# Patient Record
Sex: Male | Born: 1980 | Race: White | Hispanic: No | Marital: Single | Smoking: Light tobacco smoker
Health system: Southern US, Community
[De-identification: ages and names within clinical notes are randomized; demographics above are authoritative.]

## PROBLEM LIST (undated history)

## (undated) DIAGNOSIS — R51 Headache: Secondary | ICD-10-CM

## (undated) DIAGNOSIS — F1010 Alcohol abuse, uncomplicated: Secondary | ICD-10-CM

## (undated) DIAGNOSIS — N289 Disorder of kidney and ureter, unspecified: Secondary | ICD-10-CM

## (undated) DIAGNOSIS — R519 Headache, unspecified: Secondary | ICD-10-CM

## (undated) HISTORY — PX: MASTOIDECTOMY: SHX711

## (undated) HISTORY — PX: HAND TENDON SURGERY: SHX663

---

## 2004-04-29 ENCOUNTER — Emergency Department (HOSPITAL_COMMUNITY): Admission: EM | Admit: 2004-04-29 | Discharge: 2004-04-29 | Payer: Self-pay | Admitting: Emergency Medicine

## 2004-11-22 ENCOUNTER — Emergency Department: Payer: Self-pay | Admitting: Emergency Medicine

## 2005-01-01 ENCOUNTER — Emergency Department: Payer: Self-pay | Admitting: General Practice

## 2005-03-09 ENCOUNTER — Emergency Department: Payer: Self-pay | Admitting: Emergency Medicine

## 2005-06-23 ENCOUNTER — Emergency Department (HOSPITAL_COMMUNITY): Admission: EM | Admit: 2005-06-23 | Discharge: 2005-06-23 | Payer: Self-pay | Admitting: Emergency Medicine

## 2005-07-09 ENCOUNTER — Emergency Department: Payer: Self-pay | Admitting: Emergency Medicine

## 2005-07-15 ENCOUNTER — Emergency Department: Payer: Self-pay | Admitting: Emergency Medicine

## 2005-08-27 ENCOUNTER — Emergency Department (HOSPITAL_COMMUNITY): Admission: EM | Admit: 2005-08-27 | Discharge: 2005-08-27 | Payer: Self-pay | Admitting: *Deleted

## 2005-10-17 ENCOUNTER — Emergency Department (HOSPITAL_COMMUNITY): Admission: EM | Admit: 2005-10-17 | Discharge: 2005-10-17 | Payer: Self-pay | Admitting: Emergency Medicine

## 2005-10-28 ENCOUNTER — Emergency Department: Payer: Self-pay | Admitting: General Practice

## 2005-10-29 ENCOUNTER — Emergency Department (HOSPITAL_COMMUNITY): Admission: EM | Admit: 2005-10-29 | Discharge: 2005-10-29 | Payer: Self-pay | Admitting: Emergency Medicine

## 2006-01-16 ENCOUNTER — Emergency Department (HOSPITAL_COMMUNITY): Admission: EM | Admit: 2006-01-16 | Discharge: 2006-01-16 | Payer: Self-pay | Admitting: Emergency Medicine

## 2007-03-26 ENCOUNTER — Ambulatory Visit (HOSPITAL_COMMUNITY): Admission: RE | Admit: 2007-03-26 | Discharge: 2007-03-26 | Payer: Self-pay | Admitting: Urology

## 2007-03-28 ENCOUNTER — Ambulatory Visit (HOSPITAL_COMMUNITY): Admission: RE | Admit: 2007-03-28 | Discharge: 2007-03-28 | Payer: Self-pay | Admitting: Urology

## 2007-06-04 ENCOUNTER — Emergency Department (HOSPITAL_COMMUNITY): Admission: EM | Admit: 2007-06-04 | Discharge: 2007-06-04 | Payer: Self-pay | Admitting: Emergency Medicine

## 2009-02-14 ENCOUNTER — Emergency Department (HOSPITAL_COMMUNITY): Admission: EM | Admit: 2009-02-14 | Discharge: 2009-02-14 | Payer: Self-pay | Admitting: Emergency Medicine

## 2010-04-11 ENCOUNTER — Emergency Department (HOSPITAL_COMMUNITY)
Admission: EM | Admit: 2010-04-11 | Discharge: 2010-04-11 | Payer: Self-pay | Source: Home / Self Care | Admitting: Family Medicine

## 2010-07-12 ENCOUNTER — Encounter: Payer: Self-pay | Admitting: Urology

## 2011-03-25 LAB — URINALYSIS, ROUTINE W REFLEX MICROSCOPIC
Bilirubin Urine: NEGATIVE
Glucose, UA: NEGATIVE
Ketones, ur: NEGATIVE
Leukocytes, UA: NEGATIVE
Nitrite: NEGATIVE
Protein, ur: NEGATIVE
Specific Gravity, Urine: 1.021
Urobilinogen, UA: 0.2
pH: 6

## 2011-03-25 LAB — URINE MICROSCOPIC-ADD ON

## 2011-03-25 LAB — GC/CHLAMYDIA PROBE AMP, GENITAL
Chlamydia, DNA Probe: NEGATIVE
GC Probe Amp, Genital: NEGATIVE

## 2011-03-25 LAB — URINE CULTURE
Colony Count: NO GROWTH
Culture: NO GROWTH

## 2015-08-30 ENCOUNTER — Inpatient Hospital Stay (HOSPITAL_COMMUNITY)
Admission: EM | Admit: 2015-08-30 | Discharge: 2015-09-05 | DRG: 896 | Disposition: A | Discharge status: Home / Self Care | Payer: Self-pay | Attending: Family Medicine | Admitting: Family Medicine

## 2015-08-30 ENCOUNTER — Encounter (HOSPITAL_COMMUNITY): Payer: Self-pay | Admitting: Family Medicine

## 2015-08-30 DIAGNOSIS — K859 Acute pancreatitis without necrosis or infection, unspecified: Secondary | ICD-10-CM | POA: Diagnosis present

## 2015-08-30 DIAGNOSIS — F10951 Alcohol use, unspecified with alcohol-induced psychotic disorder with hallucinations: Secondary | ICD-10-CM | POA: Diagnosis present

## 2015-08-30 DIAGNOSIS — F10231 Alcohol dependence with withdrawal delirium: Principal | ICD-10-CM | POA: Diagnosis present

## 2015-08-30 DIAGNOSIS — F419 Anxiety disorder, unspecified: Secondary | ICD-10-CM | POA: Diagnosis present

## 2015-08-30 DIAGNOSIS — D899 Disorder involving the immune mechanism, unspecified: Secondary | ICD-10-CM | POA: Diagnosis present

## 2015-08-30 DIAGNOSIS — Z720 Tobacco use: Secondary | ICD-10-CM | POA: Diagnosis present

## 2015-08-30 DIAGNOSIS — Y900 Blood alcohol level of less than 20 mg/100 ml: Secondary | ICD-10-CM | POA: Diagnosis present

## 2015-08-30 DIAGNOSIS — F17200 Nicotine dependence, unspecified, uncomplicated: Secondary | ICD-10-CM | POA: Diagnosis present

## 2015-08-30 DIAGNOSIS — R51 Headache: Secondary | ICD-10-CM | POA: Diagnosis present

## 2015-08-30 DIAGNOSIS — F10239 Alcohol dependence with withdrawal, unspecified: Secondary | ICD-10-CM | POA: Diagnosis present

## 2015-08-30 DIAGNOSIS — F10939 Alcohol use, unspecified with withdrawal, unspecified: Secondary | ICD-10-CM | POA: Diagnosis present

## 2015-08-30 DIAGNOSIS — K858 Other acute pancreatitis without necrosis or infection: Secondary | ICD-10-CM

## 2015-08-30 DIAGNOSIS — F1010 Alcohol abuse, uncomplicated: Secondary | ICD-10-CM | POA: Diagnosis present

## 2015-08-30 DIAGNOSIS — F10931 Alcohol use, unspecified with withdrawal delirium: Secondary | ICD-10-CM | POA: Diagnosis present

## 2015-08-30 DIAGNOSIS — R519 Headache, unspecified: Secondary | ICD-10-CM

## 2015-08-30 DIAGNOSIS — R112 Nausea with vomiting, unspecified: Secondary | ICD-10-CM | POA: Diagnosis present

## 2015-08-30 LAB — RAPID URINE DRUG SCREEN, HOSP PERFORMED
AMPHETAMINES: NOT DETECTED
Barbiturates: NOT DETECTED
Benzodiazepines: POSITIVE — AB
COCAINE: NOT DETECTED
OPIATES: NOT DETECTED
TETRAHYDROCANNABINOL: NOT DETECTED

## 2015-08-30 LAB — URINE MICROSCOPIC-ADD ON
Bacteria, UA: NONE SEEN
Squamous Epithelial / LPF: NONE SEEN
WBC, UA: NONE SEEN WBC/hpf (ref 0–5)

## 2015-08-30 LAB — URINALYSIS, ROUTINE W REFLEX MICROSCOPIC
Bilirubin Urine: NEGATIVE
Glucose, UA: NEGATIVE mg/dL
Ketones, ur: NEGATIVE mg/dL
Leukocytes, UA: NEGATIVE
Nitrite: NEGATIVE
Protein, ur: NEGATIVE mg/dL
Specific Gravity, Urine: 1.015 (ref 1.005–1.030)
pH: 5.5 (ref 5.0–8.0)

## 2015-08-30 LAB — LIPASE, BLOOD: Lipase: 74 U/L — ABNORMAL HIGH (ref 11–51)

## 2015-08-30 LAB — COMPREHENSIVE METABOLIC PANEL
ALT: 101 U/L — ABNORMAL HIGH (ref 17–63)
AST: 57 U/L — ABNORMAL HIGH (ref 15–41)
Albumin: 4.1 g/dL (ref 3.5–5.0)
Alkaline Phosphatase: 111 U/L (ref 38–126)
Anion gap: 11 (ref 5–15)
BUN: 8 mg/dL (ref 6–20)
CO2: 22 mmol/L (ref 22–32)
Calcium: 9.1 mg/dL (ref 8.9–10.3)
Chloride: 104 mmol/L (ref 101–111)
Creatinine, Ser: 1.05 mg/dL (ref 0.61–1.24)
GFR calc Af Amer: >60 mL/min (ref >60)
GFR calc non Af Amer: >60 mL/min (ref >60)
Glucose, Bld: 105 mg/dL — ABNORMAL HIGH (ref 65–99)
Potassium: 3.6 mmol/L (ref 3.5–5.1)
Sodium: 137 mmol/L (ref 135–145)
Total Bilirubin: 0.8 mg/dL (ref 0.3–1.2)
Total Protein: 6.9 g/dL (ref 6.5–8.1)

## 2015-08-30 LAB — CBC
HCT: 40.8 % (ref 39.0–52.0)
Hemoglobin: 13.6 g/dL (ref 13.0–17.0)
MCH: 32.6 pg (ref 26.0–34.0)
MCHC: 33.3 g/dL (ref 30.0–36.0)
MCV: 97.8 fL (ref 78.0–100.0)
Platelets: 332 10*3/uL (ref 150–400)
RBC: 4.17 MIL/uL — ABNORMAL LOW (ref 4.22–5.81)
RDW: 13.6 % (ref 11.5–15.5)
WBC: 6.7 10*3/uL (ref 4.0–10.5)

## 2015-08-30 LAB — MAGNESIUM: Magnesium: 1.7 mg/dL (ref 1.7–2.4)

## 2015-08-30 LAB — ETHANOL: Alcohol, Ethyl (B): <5 mg/dL (ref <5)

## 2015-08-30 LAB — MRSA PCR SCREENING: MRSA BY PCR: NEGATIVE

## 2015-08-30 MED ORDER — SODIUM CHLORIDE 0.9 % IV SOLN
INTRAVENOUS | Status: DC
  Administered 2015-08-30: 11:00:00 via INTRAVENOUS

## 2015-08-30 MED ORDER — LORAZEPAM 2 MG/ML IJ SOLN: 0.0000 mg | Freq: Four times a day (QID) | INTRAMUSCULAR | Status: DC

## 2015-08-30 MED ORDER — HYDROMORPHONE HCL 1 MG/ML IJ SOLN
0.5000 mg | INTRAMUSCULAR | Status: DC | PRN
  Administered 2015-08-31 – 2015-09-01 (×4): 0.5 mg via INTRAVENOUS
  Filled 2015-08-30 (×4): qty 1

## 2015-08-30 MED ORDER — FOLIC ACID 1 MG PO TABS
1.0000 mg | ORAL_TABLET | Freq: Every day | ORAL | Status: DC
  Administered 2015-08-30 – 2015-08-31 (×2): 1 mg via ORAL
  Filled 2015-08-30 (×2): qty 1

## 2015-08-30 MED ORDER — LORAZEPAM 1 MG PO TABS: 1.0000 mg | ORAL_TABLET | Freq: Four times a day (QID) | ORAL | Status: DC | PRN

## 2015-08-30 MED ORDER — LORAZEPAM 2 MG/ML IJ SOLN
0.0000 mg | Freq: Four times a day (QID) | INTRAMUSCULAR | Status: DC
  Administered 2015-08-30 – 2015-08-31 (×2): 2 mg via INTRAVENOUS
  Filled 2015-08-30 (×3): qty 1

## 2015-08-30 MED ORDER — LORAZEPAM 2 MG/ML IJ SOLN: 1.0000 mg | Freq: Four times a day (QID) | INTRAMUSCULAR | Status: DC | PRN

## 2015-08-30 MED ORDER — THIAMINE HCL 100 MG/ML IJ SOLN
Freq: Once | INTRAVENOUS | Status: AC
  Administered 2015-08-30: 14:00:00 via INTRAVENOUS
  Filled 2015-08-30: qty 1000

## 2015-08-30 MED ORDER — PANTOPRAZOLE SODIUM 40 MG IV SOLR
40.0000 mg | Freq: Once | INTRAVENOUS | Status: AC
  Administered 2015-08-30: 40 mg via INTRAVENOUS
  Filled 2015-08-30: qty 40

## 2015-08-30 MED ORDER — LORAZEPAM 2 MG/ML IJ SOLN: 0.0000 mg | Freq: Two times a day (BID) | INTRAMUSCULAR | Status: DC

## 2015-08-30 MED ORDER — VITAMIN B-1 100 MG PO TABS
100.0000 mg | ORAL_TABLET | Freq: Every day | ORAL | Status: DC
  Administered 2015-08-31: 100 mg via ORAL
  Filled 2015-08-30 (×2): qty 1

## 2015-08-30 MED ORDER — ONDANSETRON HCL 4 MG PO TABS: 4.0000 mg | ORAL_TABLET | Freq: Four times a day (QID) | ORAL | Status: DC | PRN

## 2015-08-30 MED ORDER — THIAMINE HCL 100 MG/ML IJ SOLN
100.0000 mg | Freq: Once | INTRAMUSCULAR | Status: AC
  Administered 2015-08-30: 100 mg via INTRAVENOUS
  Filled 2015-08-30: qty 2

## 2015-08-30 MED ORDER — ENOXAPARIN SODIUM 40 MG/0.4ML ~~LOC~~ SOLN
40.0000 mg | SUBCUTANEOUS | Status: DC
  Administered 2015-08-30 – 2015-09-03 (×3): 40 mg via SUBCUTANEOUS
  Filled 2015-08-30 (×5): qty 0.4

## 2015-08-30 MED ORDER — SODIUM CHLORIDE 0.9 % IV BOLUS (SEPSIS)
2000.0000 mL | Freq: Once | INTRAVENOUS | Status: AC
  Administered 2015-08-30: 2000 mL via INTRAVENOUS

## 2015-08-30 MED ORDER — ADULT MULTIVITAMIN W/MINERALS CH
1.0000 | ORAL_TABLET | Freq: Every day | ORAL | Status: DC
  Administered 2015-08-30 – 2015-08-31 (×2): 1 via ORAL
  Filled 2015-08-30 (×2): qty 1

## 2015-08-30 MED ORDER — SODIUM CHLORIDE 0.9% FLUSH
3.0000 mL | Freq: Two times a day (BID) | INTRAVENOUS | Status: DC
Start: 2015-08-30 — End: 2015-09-05
  Administered 2015-08-30 – 2015-09-05 (×13): 3 mL via INTRAVENOUS

## 2015-08-30 MED ORDER — THIAMINE HCL 100 MG/ML IJ SOLN: 100.0000 mg | Freq: Every day | INTRAMUSCULAR | Status: DC

## 2015-08-30 MED ORDER — ONDANSETRON HCL 4 MG/2ML IJ SOLN: 4.0000 mg | Freq: Four times a day (QID) | INTRAMUSCULAR | Status: DC | PRN

## 2015-08-30 MED ORDER — LORAZEPAM 2 MG/ML IJ SOLN
2.0000 mg | INTRAMUSCULAR | Status: DC | PRN
  Administered 2015-08-30: 3 mg via INTRAVENOUS
  Filled 2015-08-30: qty 2

## 2015-08-30 NOTE — ED Notes (Addendum)
Pt here for shaking and anxiety. sts he was seen Thursday and given valium with minimal relief. sts he is a heavy drinker. sts that he has been very confused, dizzy and shaking very bad. sts he drank yesterday and it was 4 or 5 shots. sts usually a half gallon of vodka a day. sts he has also been having some right sided abd pain radiating into his back.

## 2015-08-30 NOTE — H&P (Signed)
[facility] History and Physical  Timothy Richard ZOX:[id number] DOB: 1980-12-08    PCP:   No PCP Per Patient   Chief Complaint: alcohol withdrawal.   HPI: Timothy Richard is an 35 y.o. male with hx of alcohol and tobacco abuse, wanted to quit both, presented to the ED with anxiety, tremor, and palpitation.   He saw a physician outpatient and was given Valium, but minimal relief.  Work up in the ER included a Lipase of 74, minimal elevation of LFTs, and stable hemodynamics.  He was given Ativan and had some improvement.  He denied any drug use.  Hospitalist was asked to admit him for alcohol withdrawal.  He has no prior DT or severe alcohol withdrawal.  He said he drinks hard liquor, but would not quantify.   Rewiew of Systems:  Constitutional: Negative for malaise, fever and chills. No significant weight loss or weight gain Eyes: Negative for eye pain, redness and discharge, diplopia, visual changes, or flashes of light. ENMT: Negative for ear pain, hoarseness, nasal congestion, sinus pressure and sore throat. No headaches; tinnitus, drooling, or problem swallowing. Cardiovascular: Negative for chest pain, palpitations, diaphoresis, dyspnea and peripheral edema. ; No orthopnea, PND Respiratory: Negative for cough, hemoptysis, wheezing and stridor. No pleuritic chestpain. Gastrointestinal: Negative for nausea, vomiting, diarrhea, constipation, abdominal pain, melena, blood in stool, hematemesis, jaundice and rectal bleeding.    Genitourinary: Negative for frequency, dysuria, incontinence,flank pain and hematuria; Musculoskeletal: Negative for back pain and neck pain. Negative for swelling and trauma.;  Skin: . Negative for pruritus, rash, abrasions, bruising and skin lesion.; ulcerations Neuro: Negative for headache, lightheadedness and neck stiffness. Negative for weakness, altered level of consciousness , altered mental status, extremity weakness, burning feet, involuntary  movement, seizure and syncope.  Psych: negative for anxiety, depression, insomnia, tearfulness, panic attacks, hallucinations, paranoia, suicidal or homicidal ideation    History reviewed. No pertinent past medical history.  History reviewed. No pertinent past surgical history.  Medications:  HOME MEDS: Prior to Admission medications   Medication Sig Start Date End Date Taking? Authorizing Provider  DiazePAM (VALIUM PO) Take 1 tablet by mouth daily. Reported on 08/30/2015   Yes Historical Provider, MD     Allergies:  No Known Allergies  Social History:   reports that he has been smoking.  He does not have any smokeless tobacco history on file. He reports that he drinks alcohol. He reports that he does not use illicit drugs.  Family History: History reviewed. No pertinent family history.   Physical Exam: Filed Vitals:   08/30/15 0938 08/30/15 1000 08/30/15 1030 08/30/15 1045  BP: 99/83 147/98 137/94 143/88  Pulse: 86 78 72 71  Temp: 97.9 F (36.6 C)     Resp: 18  17 22   SpO2: 100% 100% 100% 99%   Blood pressure 143/88, pulse 71, temperature 97.9 F (36.6 C), resp. rate 22, SpO2 99 %.  GEN:  Pleasant  patient lying in the stretcher in no acute distress; cooperative with exam. PSYCH:  alert and oriented x4; does not appear anxious or depressed; affect is appropriate. HEENT: Mucous membranes pink and anicteric; PERRLA; EOM intact; no cervical lymphadenopathy nor thyromegaly or carotid bruit; no JVD; There were no stridor. Neck is very supple. Breasts:: Not examined CHEST WALL: No tenderness CHEST: Normal respiration, clear to auscultation bilaterally.  HEART: Regular rate and rhythm.  There are no murmur, rub, or gallops.   BACK: No kyphosis or scoliosis; no CVA tenderness ABDOMEN: soft and non-tender; no  masses, no organomegaly, normal abdominal bowel sounds; no pannus; no intertriginous candida. There is no rebound and no distention. Rectal Exam: Not done EXTREMITIES:  No bone or joint deformity; age-appropriate arthropathy of the hands and knees; no edema; no ulcerations.  There is no calf tenderness. Genitalia: not examined PULSES: 2+ and symmetric SKIN: Normal hydration no rash or ulceration CNS: Cranial nerves 2-12 grossly intact no focal lateralizing neurologic deficit.  Speech is fluent; uvula elevated with phonation, facial symmetry and tongue midline. DTR are normal bilaterally, cerebella exam is intact, barbinski is negative and strengths are equaled bilaterally.  No sensory loss.Tremulous.    Labs on Admission:  Basic Metabolic Panel:  Recent Labs Lab 08/30/15 1013 08/30/15 1016  NA 137  --   K 3.6  --   CL 104  --   CO2 22  --   GLUCOSE 105*  --   BUN 8  --   CREATININE 1.05  --   CALCIUM 9.1  --   MG  --  1.7   Liver Function Tests:  Recent Labs Lab 08/30/15 1013  AST 57*  ALT 101*  ALKPHOS 111  BILITOT 0.8  PROT 6.9  ALBUMIN 4.1    Recent Labs Lab 08/30/15 1013  LIPASE 74*   No results for input(s): AMMONIA in the last 168 hours. CBC:  Recent Labs Lab 08/30/15 1013  WBC 6.7  HGB 13.6  HCT 40.8  MCV 97.8  PLT 332   Assessment/Plan Present on Admission:  . Alcohol withdrawal (HCC) . Pancreatitis  PLAN:  Alcohol withdrawal:   This is his day 2 of withdrawal, and it may get worse.  Will admit to step down.  He will need CIWA scheduled IV Ativan.  Will give D5 IVF, along with Thiamine and Folate.  He has mild pancreatitis, and will give clear liquid.  He was advised to quit cigarettes as well.  Thank you and Good Day.   Other plans as per orders. Code Status: FULL Unk Lightning, MD. FACP [facility] Pager [phone] 7pm to 7am.  08/30/2015, 12:06 PM

## 2015-08-30 NOTE — Progress Notes (Signed)
Patient arrived to 2C06. Patient ambulated to bed with standby assist. CHG bath completed. Patient denies pain or discomfort at this time.

## 2015-08-30 NOTE — ED Provider Notes (Signed)
CSN: [id number]     Arrival date & time 08/30/15  7829 History   First MD Initiated Contact with Patient 08/30/15 [phone]     Chief Complaint  Patient presents with  . Anxiety  . Shaking     (Consider location/radiation/quality/duration/timing/severity/associated sxs/prior Treatment) HPI Patient has been tremulous onset 4 days ago. He was seen by a walk-in clinic 3 days ago prescribed Valium for "anxiety ". He denies anxiety but feels tremulous. Patient drinks half a gallon of liquor per day. He last drank liquor yesterday. He also reports that he went to a party 2 nights ago and has no recall of the event. His friends have been telling him that he's been confused. Other associated symptoms include epigastric pain radiating to right upper quadrant and back, worse with eating. He vomited one time today, yellowish material mixed with food and has had several episodes of diarrhea, nonbloody. Denies fever. He is been treated with Valium, without relief nothing makes symptoms better or worse. History reviewed. No pertinent past medical history. alcoholic History reviewed. No pertinent past surgical history. History reviewed. No pertinent family history. Social History  Substance Use Topics  . Smoking status: Light Tobacco Smoker  . Smokeless tobacco: None  . Alcohol Use: Yes     Comment: half  gallon vodka a day   no illicit drug use  Review of Systems  Constitutional: Negative.   HENT: Negative.   Respiratory: Negative.   Cardiovascular: Negative.   Gastrointestinal: Positive for nausea, vomiting, abdominal pain and diarrhea.  Musculoskeletal: Negative.   Skin: Negative.   Allergic/Immunologic: Positive for immunocompromised state.  Neurological: Positive for dizziness and tremors.  Psychiatric/Behavioral: Negative.   All other systems reviewed and are negative.     Allergies  Review of patient's allergies indicates no known allergies.  Home Medications   Prior to Admission  medications   Not on File   BP 99/83 mmHg  Pulse 86  Temp(Src) 97.9 F (36.6 C)  Resp 18  SpO2 100% Physical Exam  Constitutional: He is oriented to person, place, and time.  Ill-appearing  HENT:  Mucous membranes dry  Eyes: Conjunctivae are normal. Pupils are equal, round, and reactive to light.  Neck: Neck supple. No tracheal deviation present. No thyromegaly present.  Cardiovascular: Normal rate and regular rhythm.   No murmur heard. Pulmonary/Chest: Effort normal and breath sounds normal.  Abdominal: Soft. Bowel sounds are normal. He exhibits no distension. There is tenderness.  Tender at epigastrium  Musculoskeletal: Normal range of motion. He exhibits no edema or tenderness.  Neurological: He is alert and oriented to person, place, and time. No cranial nerve deficit. Coordination normal.  Tremulous  Skin: Skin is warm and dry. No rash noted.  Psychiatric: He has a normal mood and affect.  Nursing note and vitals reviewed.   ED Course  Procedures (including critical care time) Labs Review Labs Reviewed  LIPASE, BLOOD  COMPREHENSIVE METABOLIC PANEL  CBC  URINALYSIS, ROUTINE W REFLEX MICROSCOPIC (NOT AT [facility])    Imaging Review No results found. I have personally reviewed and evaluated these images and lab results as part of my medical decision-making.   EKG Interpretation   Date/Time:  Sunday August 30 2015 11:30:23 EDT Ventricular Rate:  75 PR Interval:  167 QRS Duration: 100 QT Interval:  373 QTC Calculation: 417 R Axis:   54 Text Interpretation:  Sinus rhythm No old tracing to compare Confirmed by  Ethelda Chick  MD, Ayame Rena [phone]) on 08/30/2015 11:58:20 AM  11:15 AM patient feels improved after treatment with intravenous fluids and intravenous lorazepam. He is alert. Continues to be tremulous however tremors are less pronounced. Results for orders placed or performed during the hospital encounter of 08/30/15  Lipase, blood  Result Value Ref Range    Lipase 74 (H) 11 - 51 U/L  Comprehensive metabolic panel  Result Value Ref Range   Sodium 137 135 - 145 mmol/L   Potassium 3.6 3.5 - 5.1 mmol/L   Chloride 104 101 - 111 mmol/L   CO2 22 22 - 32 mmol/L   Glucose, Bld 105 (H) 65 - 99 mg/dL   BUN 8 6 - 20 mg/dL   Creatinine, Ser 1.611.05 0.61 - 1.24 mg/dL   Calcium 9.1 8.9 - 09.610.3 mg/dL   Total Protein 6.9 6.5 - 8.1 g/dL   Albumin 4.1 3.5 - 5.0 g/dL   AST 57 (H) 15 - 41 U/L   ALT 101 (H) 17 - 63 U/L   Alkaline Phosphatase 111 38 - 126 U/L   Total Bilirubin 0.8 0.3 - 1.2 mg/dL   GFR calc non Af Amer >60 >60 mL/min   GFR calc Af Amer >60 >60 mL/min   Anion gap 11 5 - 15  CBC  Result Value Ref Range   WBC 6.7 4.0 - 10.5 K/uL   RBC 4.17 (L) 4.22 - 5.81 MIL/uL   Hemoglobin 13.6 13.0 - 17.0 g/dL   HCT 04.540.8 40.939.0 - 81.152.0 %   MCV 97.8 78.0 - 100.0 fL   MCH 32.6 26.0 - 34.0 pg   MCHC 33.3 30.0 - 36.0 g/dL   RDW 91.413.6 78.211.5 - 95.615.5 %   Platelets 332 150 - 400 K/uL  Magnesium  Result Value Ref Range   Magnesium 1.7 1.7 - 2.4 mg/dL   No results found.  MDM  Dr. Conley RollsLe consulted and will see patient in the emergency department to arrange for inpatient stay He is withdrawing from alcohol. May have element of alcoholic pancreatitis even vomiting and elevated lipase Final diagnoses:  None   diagnosis #1 acute alcohol withdrawal #2 abdominal pain 3 chronic and acute alcohol abuse CRITICAL CARE Performed by: Doug SouJACUBOWITZ,Starletta Houchin Total critical care time: 35 minutes Critical care time was exclusive of separately billable procedures and treating other patients. Critical care was necessary to treat or prevent imminent or life-threatening deterioration. Critical care was time spent personally by me on the following activities: development of treatment plan with patient and/or surrogate as well as nursing, discussions with consultants, evaluation of patient's response to treatment, examination of patient, obtaining history from patient or surrogate, ordering and  performing treatments and interventions, ordering and review of laboratory studies, ordering and review of radiographic studies, pulse oximetry and re-evaluation of patient's condition.    Doug SouSam Mitchell Iwanicki, MD 08/30/15 1201

## 2015-08-30 NOTE — Progress Notes (Signed)
Patient with symmetrical face, clear voice, A&O person, place, month, year.  Strong and equal upper and lower extremities to resistance.  Clearly observable tremor to right hand.  Pt c/o headache 6/10 pain, and right lower quad pain of abdomen that radiates to the hip and lower back; this pain is rated an 8/10.

## 2015-08-31 DIAGNOSIS — F1010 Alcohol abuse, uncomplicated: Secondary | ICD-10-CM

## 2015-08-31 LAB — CBC
HEMATOCRIT: 41.1 % (ref 39.0–52.0)
HEMOGLOBIN: 13.4 g/dL (ref 13.0–17.0)
MCH: 32.2 pg (ref 26.0–34.0)
MCHC: 32.6 g/dL (ref 30.0–36.0)
MCV: 98.8 fL (ref 78.0–100.0)
Platelets: 299 10*3/uL (ref 150–400)
RBC: 4.16 MIL/uL — AB (ref 4.22–5.81)
RDW: 13.5 % (ref 11.5–15.5)
WBC: 5 10*3/uL (ref 4.0–10.5)

## 2015-08-31 LAB — COMPREHENSIVE METABOLIC PANEL
ALBUMIN: 3.6 g/dL (ref 3.5–5.0)
ALK PHOS: 98 U/L (ref 38–126)
ALT: 76 U/L — ABNORMAL HIGH (ref 17–63)
ANION GAP: 10 (ref 5–15)
AST: 38 U/L (ref 15–41)
BUN: <5 mg/dL — ABNORMAL LOW (ref 6–20)
CALCIUM: 9.2 mg/dL (ref 8.9–10.3)
CHLORIDE: 107 mmol/L (ref 101–111)
CO2: 24 mmol/L (ref 22–32)
Creatinine, Ser: 0.99 mg/dL (ref 0.61–1.24)
GFR calc Af Amer: >60 mL/min (ref >60)
GFR calc non Af Amer: >60 mL/min (ref >60)
GLUCOSE: 96 mg/dL (ref 65–99)
Potassium: 3.8 mmol/L (ref 3.5–5.1)
SODIUM: 141 mmol/L (ref 135–145)
Total Bilirubin: 0.9 mg/dL (ref 0.3–1.2)
Total Protein: 6.5 g/dL (ref 6.5–8.1)

## 2015-08-31 MED ORDER — LORAZEPAM 2 MG/ML IJ SOLN
2.0000 mg | INTRAMUSCULAR | Status: DC | PRN
  Administered 2015-08-31 – 2015-09-03 (×11): 2 mg via INTRAVENOUS
  Administered 2015-09-03: 3 mg via INTRAVENOUS
  Administered 2015-09-03 – 2015-09-04 (×6): 2 mg via INTRAVENOUS
  Filled 2015-08-31 (×15): qty 1
  Filled 2015-08-31: qty 2
  Filled 2015-08-31 (×3): qty 1

## 2015-08-31 MED ORDER — CLONIDINE HCL 0.1 MG PO TABS
0.1000 mg | ORAL_TABLET | Freq: Three times a day (TID) | ORAL | Status: DC
  Administered 2015-08-31 – 2015-09-04 (×12): 0.1 mg via ORAL
  Filled 2015-08-31 (×12): qty 1

## 2015-08-31 MED ORDER — ADULT MULTIVITAMIN W/MINERALS CH
1.0000 | ORAL_TABLET | Freq: Every day | ORAL | Status: DC
  Administered 2015-09-01 – 2015-09-05 (×5): 1 via ORAL
  Filled 2015-08-31 (×5): qty 1

## 2015-08-31 MED ORDER — VITAMIN B-1 100 MG PO TABS
100.0000 mg | ORAL_TABLET | Freq: Every day | ORAL | Status: DC
  Administered 2015-09-01 – 2015-09-05 (×5): 100 mg via ORAL
  Filled 2015-08-31 (×5): qty 1

## 2015-08-31 MED ORDER — TRAMADOL HCL 50 MG PO TABS
50.0000 mg | ORAL_TABLET | Freq: Four times a day (QID) | ORAL | Status: DC | PRN
  Administered 2015-08-31 – 2015-09-04 (×5): 50 mg via ORAL
  Filled 2015-08-31 (×6): qty 1

## 2015-08-31 MED ORDER — ACETAMINOPHEN 325 MG PO TABS: 650.0000 mg | ORAL_TABLET | Freq: Four times a day (QID) | ORAL | Status: DC | PRN

## 2015-08-31 MED ORDER — MORPHINE SULFATE (PF) 2 MG/ML IV SOLN: 1.0000 mg | INTRAVENOUS | Status: DC | PRN

## 2015-08-31 MED ORDER — FOLIC ACID 1 MG PO TABS
1.0000 mg | ORAL_TABLET | Freq: Every day | ORAL | Status: DC
  Administered 2015-09-01 – 2015-09-05 (×5): 1 mg via ORAL
  Filled 2015-08-31 (×5): qty 1

## 2015-08-31 NOTE — Progress Notes (Signed)
[place] TEAM 1 - Stepdown/ICU TEAM PROGRESS NOTE  Timothy Richard NWG:[id number] DOB: 02/05/1981 DOA: 08/30/2015 PCP: No PCP Per Patient  Admit HPI / Brief Narrative: 35 y.o. male with hx of alcohol and tobacco abuse, wanted to quit both, presented to the ED with anxiety, tremor, and palpitations. He saw an outpatient physician and was given Valium with minimal relief. Work up in the ER included a Lipase of 74, minimal elevation of LFTs, and stable hemodynamics. He was given Ativan and had some improvement. He denied any drug use. He has no prior DT or severe alcohol withdrawal. He said he drinks hard liquor, but would not quantify.   HPI/Subjective: The patient is very tremulous and anxious.  He complains of severe uncontrolled headache.  He states he is very hungry.  He denies nausea vomiting.  He denies chest pain.  Assessment/Plan:  Acute EtOH withdrawal  Continue CIWA protocol  EtOH abuse - Tobacco abuse  Pt desires to stop drinking permanently   Code Status: FULL Family Communication: no family present at time of exam Disposition Plan: SDU   Consultants: none  Procedures: none  Antibiotics: none  DVT prophylaxis: lovenox   Objective: Blood pressure 122/105, pulse 62, temperature 97.8 F (36.6 C), temperature source Oral, resp. rate 13, height  (1.778 m), weight 81.874 kg (180 lb 8 oz), SpO2 100 %.  Intake/Output Summary (Last 24 hours) at 08/31/15 1346 Last data filed at 08/31/15 1200  Gross per 24 hour  Intake    486 ml  Output   3125 ml  Net  -2639 ml   Exam: General: No acute respiratory distress - alert but agitated  Lungs: Clear to auscultation bilaterally without wheezes or crackles Cardiovascular: Regular rate and rhythm without murmur gallop or rub  Abdomen: Nontender, nondistended, soft, bowel sounds positive, no rebound, no ascites, no appreciable mass Extremities: No significant cyanosis, clubbing, or edema bilateral lower  extremities  Data Reviewed:  Basic Metabolic Panel:  Recent Labs Lab 08/30/15 1013 08/30/15 1016 08/31/15 0304  NA 137  --  141  K 3.6  --  3.8  CL 104  --  107  CO2 22  --  24  GLUCOSE 105*  --  96  BUN 8  --  <5*  CREATININE 1.05  --  0.99  CALCIUM 9.1  --  9.2  MG  --  1.7  --     CBC:  Recent Labs Lab 08/30/15 1013 08/31/15 0304  WBC 6.7 5.0  HGB 13.6 13.4  HCT 40.8 41.1  MCV 97.8 98.8  PLT 332 299    Liver Function Tests:  Recent Labs Lab 08/30/15 1013 08/31/15 0304  AST 57* 38  ALT 101* 76*  ALKPHOS 111 98  BILITOT 0.8 0.9  PROT 6.9 6.5  ALBUMIN 4.1 3.6    Recent Labs Lab 08/30/15 1013  LIPASE 74*   No results for input(s): AMMONIA in the last 168 hours.  Coags: No results for input(s): INR in the last 168 hours.  Invalid input(s): PT No results for input(s): APTT in the last 168 hours.   Recent Results (from the past 240 hour(s))  MRSA PCR Screening     Status: None   Collection Time: 08/30/15  1:36 PM  Result Value Ref Range Status   MRSA by PCR NEGATIVE NEGATIVE Final    Comment:        The GeneXpert MRSA Assay (FDA approved for NASAL specimens only), is one component of a comprehensive  MRSA colonization surveillance program. It is not intended to diagnose MRSA infection nor to guide or monitor treatment for MRSA infections.      Studies:   Recent x-ray studies have been reviewed in detail by the Attending Physician  Scheduled Meds:  Scheduled Meds: . enoxaparin (LOVENOX) injection  40 mg Subcutaneous Q24H  . folic acid  1 mg Oral Daily  . LORazepam  0-4 mg Intravenous Q6H   Followed by  . [START ON 09/01/2015] LORazepam  0-4 mg Intravenous Q12H  . multivitamin with minerals  1 tablet Oral Daily  . sodium chloride flush  3 mL Intravenous Q12H  . thiamine  100 mg Oral Daily   Or  . thiamine  100 mg Intravenous Daily    Time spent on care of this patient: 35 mins   MCCLUNG,JEFFREY T , MD   [facility] Office  [phone] Pager - Text Page per Loretha StaplerAmion as per below:  On-Call/Text Page:      [email or web address]      password [email or web address]  If 7PM-7AM, please contact night-coverage [email or web address] Password [email or web address] 08/31/2015, 1:46 PM   LOS: 1 day

## 2015-09-01 DIAGNOSIS — Z720 Tobacco use: Secondary | ICD-10-CM | POA: Diagnosis present

## 2015-09-01 DIAGNOSIS — F10231 Alcohol dependence with withdrawal delirium: Secondary | ICD-10-CM | POA: Diagnosis present

## 2015-09-01 DIAGNOSIS — F10931 Alcohol use, unspecified with withdrawal delirium: Secondary | ICD-10-CM | POA: Diagnosis present

## 2015-09-01 DIAGNOSIS — R519 Headache, unspecified: Secondary | ICD-10-CM | POA: Diagnosis present

## 2015-09-01 DIAGNOSIS — R51 Headache: Secondary | ICD-10-CM

## 2015-09-01 DIAGNOSIS — F1010 Alcohol abuse, uncomplicated: Secondary | ICD-10-CM | POA: Diagnosis present

## 2015-09-01 LAB — CBC
HCT: 39.9 % (ref 39.0–52.0)
HEMOGLOBIN: 12.9 g/dL — AB (ref 13.0–17.0)
MCH: 32.2 pg (ref 26.0–34.0)
MCHC: 32.3 g/dL (ref 30.0–36.0)
MCV: 99.5 fL (ref 78.0–100.0)
Platelets: 251 10*3/uL (ref 150–400)
RBC: 4.01 MIL/uL — AB (ref 4.22–5.81)
RDW: 13.3 % (ref 11.5–15.5)
WBC: 5.2 10*3/uL (ref 4.0–10.5)

## 2015-09-01 LAB — PROTIME-INR
INR: 0.98 (ref 0.00–1.49)
PROTHROMBIN TIME: 13.2 s (ref 11.6–15.2)

## 2015-09-01 LAB — COMPREHENSIVE METABOLIC PANEL
ALBUMIN: 3.3 g/dL — AB (ref 3.5–5.0)
ALT: 63 U/L (ref 17–63)
AST: 33 U/L (ref 15–41)
Alkaline Phosphatase: 98 U/L (ref 38–126)
Anion gap: 10 (ref 5–15)
BUN: 6 mg/dL (ref 6–20)
CHLORIDE: 106 mmol/L (ref 101–111)
CO2: 23 mmol/L (ref 22–32)
CREATININE: 1.03 mg/dL (ref 0.61–1.24)
Calcium: 9.2 mg/dL (ref 8.9–10.3)
GFR calc Af Amer: >60 mL/min (ref >60)
GFR calc non Af Amer: >60 mL/min (ref >60)
GLUCOSE: 131 mg/dL — AB (ref 65–99)
POTASSIUM: 3.7 mmol/L (ref 3.5–5.1)
SODIUM: 139 mmol/L (ref 135–145)
TOTAL PROTEIN: 6.1 g/dL — AB (ref 6.5–8.1)
Total Bilirubin: 0.5 mg/dL (ref 0.3–1.2)

## 2015-09-01 LAB — VITAMIN B12: Vitamin B-12: 764 pg/mL (ref 180–914)

## 2015-09-01 LAB — FOLATE: FOLATE: 11.2 ng/mL (ref >5.9)

## 2015-09-01 LAB — AMMONIA: Ammonia: 41 umol/L — ABNORMAL HIGH (ref 9–35)

## 2015-09-01 MED ORDER — NICOTINE 21 MG/24HR TD PT24
21.0000 mg | MEDICATED_PATCH | Freq: Every day | TRANSDERMAL | Status: DC
  Administered 2015-09-01 – 2015-09-05 (×5): 21 mg via TRANSDERMAL
  Filled 2015-09-01 (×5): qty 1

## 2015-09-01 MED ORDER — SUMATRIPTAN SUCCINATE 6 MG/0.5ML ~~LOC~~ SOLN
6.0000 mg | Freq: Two times a day (BID) | SUBCUTANEOUS | Status: DC | PRN
  Administered 2015-09-02 (×2): 6 mg via SUBCUTANEOUS
  Filled 2015-09-01 (×3): qty 0.5

## 2015-09-01 NOTE — Progress Notes (Signed)
[place] TEAM 1 - Stepdown/ICU TEAM Progress Note  Timothy Richard:[id number] DOB: 11-03-1980 DOA: 08/30/2015 PCP: No PCP Per Patient  Admit HPI / Brief Narrative: [name] PMHx ETOH abuse, Tobacco abuse.  Wanted to quit both, presented to the ED with anxiety, tremor, and palpitation. He saw a physician outpatient and was given Valium, but minimal relief. Work up in the ER included a Lipase of 74, minimal elevation of LFTs, and stable hemodynamics. He was given Ativan and had some improvement. He denied any drug use. Hospitalist was asked to admit him for alcohol withdrawal. He has no prior DT or severe alcohol withdrawal. He said he drinks hard liquor, but would not quantify.    HPI/Subjective: [name], states drinks 1/2 gallon per day liquor. States was having tremors earlier today, and headache which centered for head and behind eyes. Negative photosensitivity   Assessment/Plan:  Acute EtOH withdrawal/Headache  -Continue CIWA protocol -Sumatriptan 6 mg subcutaneous BID PRN  EtOH abuse - Tobacco abuse  -Pt desires to stop drinking permanently  -Nicotine patch   Code Status: FULL Family Communication: no family present at time of exam Disposition Plan: Resolution withdrawals    Consultants: none  Procedure/Significant Events: none   Culture none  Antibiotics: none  DVT prophylaxis: Lovenox   Devices NA  LINES / TUBES:  NA    Continuous Infusions:   Objective: VITAL SIGNS: Temp: 98.3 F (36.8 C) (03/14 1745) Temp Source: Oral (03/14 1745) BP: 128/98 mmHg (03/14 1745) Pulse Rate: 90 (03/14 1745) SPO2; FIO2:   Intake/Output Summary (Last 24 hours) at 09/01/15 1859 Last data filed at 09/01/15 1700  Gross per 24 hour  Intake    363 ml  Output   1525 ml  Net  -1162 ml     Exam: General:  A/O 4,, headache refractory to treatment, No acute respiratory distress Eyes: Negative headache, negative scleral  hemorrhage ENT: Negative Runny nose, negative gingival bleeding, Neck:  Negative scars, masses, torticollis, lymphadenopathy, JVD Lungs: Clear to auscultation bilaterally without wheezes or crackles Cardiovascular: Regular rate and rhythm without murmur gallop or rub normal S1 and S2 Abdomen:negative abdominal pain, nondistended, positive soft, bowel sounds, no rebound, no ascites, no appreciable mass Extremities: No significant cyanosis, clubbing, or edema bilateral lower extremities Psychiatric:  Negative depression, negative anxiety, negative fatigue, negative mania  Neurologic:  Cranial nerves II through XII intact, tongue/uvula midline, all extremities muscle strength 5/5, sensation intact throughout,  negative dysarthria, negative expressive aphasia, negative receptive aphasia.   Data Reviewed: Basic Metabolic Panel:  Recent Labs Lab 08/30/15 1013 08/30/15 1016 08/31/15 0304 09/01/15 0220  NA 137  --  141 139  K 3.6  --  3.8 3.7  CL 104  --  107 106  CO2 22  --  24 23  GLUCOSE 105*  --  96 131*  BUN 8  --  <5* 6  CREATININE 1.05  --  0.99 1.03  CALCIUM 9.1  --  9.2 9.2  MG  --  1.7  --   --    Liver Function Tests:  Recent Labs Lab 08/30/15 1013 08/31/15 0304 09/01/15 0220  AST 57* 38 33  ALT 101* 76* 63  ALKPHOS 111 98 98  BILITOT 0.8 0.9 0.5  PROT 6.9 6.5 6.1*  ALBUMIN 4.1 3.6 3.3*    Recent Labs Lab 08/30/15 1013  LIPASE 74*    Recent Labs Lab 09/01/15 0220  AMMONIA 41*   CBC:  Recent Labs Lab  08/30/15 1013 08/31/15 0304 09/01/15 0220  WBC 6.7 5.0 5.2  HGB 13.6 13.4 12.9*  HCT 40.8 41.1 39.9  MCV 97.8 98.8 99.5  PLT 332 299 251   Cardiac Enzymes: No results for input(s): CKTOTAL, CKMB, CKMBINDEX, TROPONINI in the last 168 hours. BNP (last 3 results) No results for input(s): BNP in the last 8760 hours.  ProBNP (last 3 results) No results for input(s): PROBNP in the last 8760 hours.  CBG: No results for input(s): GLUCAP in the last  168 hours.  Recent Results (from the past 240 hour(s))  MRSA PCR Screening     Status: None   Collection Time: 08/30/15  1:36 PM  Result Value Ref Range Status   MRSA by PCR NEGATIVE NEGATIVE Final    Comment:        The GeneXpert MRSA Assay (FDA approved for NASAL specimens only), is one component of a comprehensive MRSA colonization surveillance program. It is not intended to diagnose MRSA infection nor to guide or monitor treatment for MRSA infections.      Studies:  Recent x-ray studies have been reviewed in detail by the Attending Physician  Scheduled Meds:  Scheduled Meds: . cloNIDine  0.1 mg Oral TID  . enoxaparin (LOVENOX) injection  40 mg Subcutaneous Q24H  . folic acid  1 mg Oral Daily  . multivitamin with minerals  1 tablet Oral Daily  . nicotine  21 mg Transdermal Daily  . sodium chloride flush  3 mL Intravenous Q12H  . thiamine  100 mg Oral Daily    Time spent on care of this patient: 40 mins   Timothy Richard , MD  [facility] Office  [phone] Pager - [phone]  On-Call/Text Page:      [email or web address]      password [email or web address]  If 7PM-7AM, please contact night-coverage [email or web address] Password [email or web address] 09/01/2015, 6:59 PM   LOS: 2 days   Care during the described time interval was provided by me .  I have reviewed this patient's available data, including medical history, events of note, physical examination, and all test results as part of my evaluation. I have personally reviewed and interpreted all radiology studies.   Carolyne Littlesurtis Woods, MD [phone] Pager

## 2015-09-01 NOTE — Progress Notes (Signed)
Utilization review completed. Arnol Mcgibbon, RN, BSN. 

## 2015-09-02 DIAGNOSIS — Z720 Tobacco use: Secondary | ICD-10-CM

## 2015-09-02 DIAGNOSIS — F10231 Alcohol dependence with withdrawal delirium: Principal | ICD-10-CM

## 2015-09-02 DIAGNOSIS — R51 Headache: Secondary | ICD-10-CM

## 2015-09-02 MED ORDER — BUTALBITAL-APAP-CAFFEINE 50-325-40 MG PO TABS
1.0000 | ORAL_TABLET | ORAL | Status: DC | PRN
  Administered 2015-09-02 – 2015-09-04 (×7): 2 via ORAL
  Filled 2015-09-02 (×7): qty 2

## 2015-09-02 MED ORDER — IBUPROFEN 200 MG PO TABS
200.0000 mg | ORAL_TABLET | Freq: Four times a day (QID) | ORAL | Status: DC | PRN
  Administered 2015-09-03 (×2): 400 mg via ORAL
  Filled 2015-09-02: qty 2
  Filled 2015-09-02: qty 1

## 2015-09-02 MED ORDER — HYDRALAZINE HCL 20 MG/ML IJ SOLN: 5.0000 mg | INTRAMUSCULAR | Status: DC | PRN

## 2015-09-02 NOTE — Progress Notes (Addendum)
[place] TEAM 1 - Stepdown/ICU TEAM PROGRESS NOTE  Geoffery SpruceChristopher B Gervin ZOX:[id number] DOB: 31-Jan-1981 DOA: 08/30/2015 PCP: No PCP Per Patient  Admit HPI / Brief Narrative: 35 y.o. male with hx of alcohol and tobacco abuse, wanted to quit both, presented to the ED with anxiety, tremor, and palpitations. He saw an outpatient physician and was given Valium with minimal relief. Work up in the ER included a Lipase of 74, minimal elevation of LFTs, and stable hemodynamics. He was given Ativan and had some improvement. He denied any drug use. He has no prior DT or severe alcohol withdrawal. He said he drinks hard liquor, but would not quantify.   HPI/Subjective: The patient complains of severe ongoing headache that has not responded "even a little bit" to any of the medications he has been given.  He also reports worsening of his withdrawal-type tremors and agitation today.  He denies chest pain shortness of breath fevers chills nausea or vomiting.  Assessment/Plan:  Acute EtOH withdrawal  Continue CIWA protocol for now  Refractory HA No sx to suggests actual migraine - avoid narcotics - trial of fioricet   EtOH abuse - Tobacco abuse  Pt desires to stop drinking permanently - will counsel further on community options after withdrawal stabilized   Code Status: FULL Family Communication: no family present at time of exam Disposition Plan: SDU   Consultants: none  Procedures: none  Antibiotics: none  DVT prophylaxis: lovenox   Objective: Blood pressure 94/56, pulse 60, temperature 98.6 F (37 C), temperature source Oral, resp. rate 12, height 5\' 10"  (1.778 m), weight 81.874 kg (180 lb 8 oz), SpO2 97 %.  Intake/Output Summary (Last 24 hours) at 09/02/15 1547 Last data filed at 09/02/15 [id number]  Gross per 24 hour  Intake    603 ml  Output   2100 ml  Net  -1497 ml   Exam: General: No acute respiratory distress - alert and tremulous  Lungs: Clear to auscultation  bilaterally - no wheezes or crackles Cardiovascular: Regular rate and rhythm without murmur  Abdomen: Nontender, nondistended, soft, bowel sounds positive, no rebound, no ascites, no appreciable mass Extremities: No significant cyanosis, clubbing, edema bilateral lower extremities  Data Reviewed:  Basic Metabolic Panel:  Recent Labs Lab 08/30/15 1013 08/30/15 1016 08/31/15 0304 09/01/15 0220  NA 137  --  141 139  K 3.6  --  3.8 3.7  CL 104  --  107 106  CO2 22  --  24 23  GLUCOSE 105*  --  96 131*  BUN 8  --  <5* 6  CREATININE 1.05  --  0.99 1.03  CALCIUM 9.1  --  9.2 9.2  MG  --  1.7  --   --     CBC:  Recent Labs Lab 08/30/15 1013 08/31/15 0304 09/01/15 0220  WBC 6.7 5.0 5.2  HGB 13.6 13.4 12.9*  HCT 40.8 41.1 39.9  MCV 97.8 98.8 99.5  PLT 332 299 251    Liver Function Tests:  Recent Labs Lab 08/30/15 1013 08/31/15 0304 09/01/15 0220  AST 57* 38 33  ALT 101* 76* 63  ALKPHOS 111 98 98  BILITOT 0.8 0.9 0.5  PROT 6.9 6.5 6.1*  ALBUMIN 4.1 3.6 3.3*    Recent Labs Lab 08/30/15 1013  LIPASE 74*    Recent Labs Lab 09/01/15 0220  AMMONIA 41*    Coags:  Recent Labs Lab 09/01/15 0220  INR 0.98    Recent Results (from the past 240 hour(s))  MRSA PCR Screening     Status: None   Collection Time: 08/30/15  1:36 PM  Result Value Ref Range Status   MRSA by PCR NEGATIVE NEGATIVE Final    Comment:        The GeneXpert MRSA Assay (FDA approved for NASAL specimens only), is one component of a comprehensive MRSA colonization surveillance program. It is not intended to diagnose MRSA infection nor to guide or monitor treatment for MRSA infections.      Studies:   Recent x-ray studies have been reviewed in detail by the Attending Physician  Scheduled Meds:  Scheduled Meds: . cloNIDine  0.1 mg Oral TID  . enoxaparin (LOVENOX) injection  40 mg Subcutaneous Q24H  . folic acid  1 mg Oral Daily  . multivitamin with minerals  1 tablet Oral  Daily  . nicotine  21 mg Transdermal Daily  . sodium chloride flush  3 mL Intravenous Q12H  . thiamine  100 mg Oral Daily    Time spent on care of this patient: 25 mins   [facility] T , MD   [facility] Office  [phone] Pager - Text Page per Loretha Stapler as per below:  On-Call/Text Page:      [email or web address]      password [email or web address]  If 7PM-7AM, please contact night-coverage [email or web address] Password [email or web address] 09/02/2015, 3:47 PM   LOS: 3 days

## 2015-09-03 DIAGNOSIS — F10951 Alcohol use, unspecified with alcohol-induced psychotic disorder with hallucinations: Secondary | ICD-10-CM | POA: Diagnosis present

## 2015-09-03 LAB — PROTIME-INR
INR: 0.99 (ref 0.00–1.49)
PROTHROMBIN TIME: 13.3 s (ref 11.6–15.2)

## 2015-09-03 LAB — CBC
HCT: 43.4 % (ref 39.0–52.0)
HEMOGLOBIN: 14.4 g/dL (ref 13.0–17.0)
MCH: 32.4 pg (ref 26.0–34.0)
MCHC: 33.2 g/dL (ref 30.0–36.0)
MCV: 97.7 fL (ref 78.0–100.0)
Platelets: 261 10*3/uL (ref 150–400)
RBC: 4.44 MIL/uL (ref 4.22–5.81)
RDW: 13.1 % (ref 11.5–15.5)
WBC: 6.8 10*3/uL (ref 4.0–10.5)

## 2015-09-03 LAB — COMPREHENSIVE METABOLIC PANEL
ALK PHOS: 97 U/L (ref 38–126)
ALT: 63 U/L (ref 17–63)
ANION GAP: 10 (ref 5–15)
AST: 34 U/L (ref 15–41)
Albumin: 3.7 g/dL (ref 3.5–5.0)
BILIRUBIN TOTAL: 0.1 mg/dL — AB (ref 0.3–1.2)
BUN: 12 mg/dL (ref 6–20)
CALCIUM: 9.3 mg/dL (ref 8.9–10.3)
CO2: 23 mmol/L (ref 22–32)
Chloride: 105 mmol/L (ref 101–111)
Creatinine, Ser: 1.11 mg/dL (ref 0.61–1.24)
GLUCOSE: 111 mg/dL — AB (ref 65–99)
Potassium: 4 mmol/L (ref 3.5–5.1)
Sodium: 138 mmol/L (ref 135–145)
TOTAL PROTEIN: 6.8 g/dL (ref 6.5–8.1)

## 2015-09-03 LAB — LIPASE, BLOOD: Lipase: 127 U/L — ABNORMAL HIGH (ref 11–51)

## 2015-09-03 MED ORDER — SUMATRIPTAN SUCCINATE 6 MG/0.5ML ~~LOC~~ SOLN
6.0000 mg | Freq: Two times a day (BID) | SUBCUTANEOUS | Status: DC | PRN
  Administered 2015-09-03 – 2015-09-04 (×2): 6 mg via SUBCUTANEOUS
  Filled 2015-09-03 (×6): qty 0.5

## 2015-09-03 NOTE — Clinical Social Work Note (Signed)
Clinical Social Work Assessment  Patient Details  Name: Timothy Richard MRN: [id number] Date of Birth: 1981-02-22  Date of referral:  09/03/15               Reason for consult:  Substance Use/ETOH Abuse                Permission sought to share information with:  Facility Art therapist granted to share information::  Yes, Verbal Permission Granted  Name::        Agency::  Substance Abuse programs  Relationship::     Contact Information:     Housing/Transportation Living arrangements for the past 2 months:  Apartment Source of Information:  Patient Patient Interpreter Needed:  None Criminal Activity/Legal Involvement Pertinent to Current Situation/Hospitalization:  No - Comment as needed Significant Relationships:  Dependent Children Lives with:  Self Do you feel safe going back to the place where you live?  No (believes he will drink if he returns home) Need for family participation in patient care:  No (Coment)  Care giving concerns: none- pt is independent and has a job   Facilities manager / plan: CSW met with pt to discuss current alcohol use.  Pt reports drinking a half gallon of vodka a day- states he has been drinking heavily for past 10 years.  Pt states that he drinks daily- usually drinking during work at [employer] and then binge drinking once he gets off work- states that he often drinks alcohol in the morning to get going ("like his morning coffee").  Pt reports having 3 children who he pays child support from (separated from mother)- states they are his main motivation for quitting drinking at this time. Reports that they stay with him over the weekend sometimes and that he is able to abstain from drinking for the whole time they are there.  Pt is conflicted about type of rehab he needs: if he goes to inpatient he is scared of losing his job (though he reports they know of his drinking problem and thinks they would accommodate him) and  visitation rights to his children,  If he goes to outpatient pt thinks that he would definitely end up drinking again.  CSW discussed conflict in depth with pt- pt reports he is scared of losing his life if he goes to inpatient and loses his job/children/house but acknowledges that if he continues to drink he will lose those anyway.  Pt states this is his first hospital stay because of alcohol and now sees how detrimental it has been to his health.  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  Patient/Family's Understanding of and Emotional Response to Diagnosis, Current Treatment, and Prognosis:  Pt is very conflicted about his options at this time but understands the severity of his condition and the consequences for continuing to drink  Emotional Assessment Appearance:  Appears stated age Attitude/Demeanor/Rapport:    Affect (typically observed):  Appropriate Orientation:  Oriented to Situation, Oriented to  Time, Oriented to Place, Oriented to Self Alcohol / Substance use:  Alcohol Use Psych involvement (Current and /or in the community):  No (Comment)  Discharge Needs  Concerns to be addressed:  Discharge Planning Concerns, Substance Abuse  Concerns Readmission within the last 30 days:  No Current discharge risk:  Substance Abuse Barriers to Discharge:  Continued Medical Work up   [employer], LCSW 09/03/2015, 5:02 PM

## 2015-09-03 NOTE — Progress Notes (Signed)
[place] TEAM 1 - Stepdown/ICU TEAM Progress Note  Timothy Richard:[id number] DOB: 1980-11-06 DOA: 08/30/2015 PCP: No PCP Per Patient  Admit HPI / Brief Narrative: [name] PMHx ETOH abuse, Tobacco abuse.  Wanted to quit both, presented to the ED with anxiety, tremor, and palpitation. He saw a physician outpatient and was given Valium, but minimal relief. Work up in the ER included a Lipase of 74, minimal elevation of LFTs, and stable hemodynamics. He was given Ativan and had some improvement. He denied any drug use. Hospitalist was asked to admit him for alcohol withdrawal. He has no prior DT or severe alcohol withdrawal. He said he drinks hard liquor, but would not quantify.    HPI/Subjective: 3/16 A/O 4, states drinks 1/2 gallon per day liquor. States continues to have tremors, night sweats, hallucinations (seeing people's faces in the window). Also continues to have headache refractory to Fioricet. Negative N/V, negative abdominal pain.    Assessment/Plan:  Acute EtOH withdrawal/Headache  -Continue CIWA protocol -Fioricet as primary headache medication, however if continues to be refractory Sumatriptan -Sumatriptan 6 mg subcutaneous BID PRN  Hallucinations -See acute alcohol withdrawal  EtOH abuse - Tobacco abuse  -Pt desires to stop drinking permanently  -Nicotine patch   Code Status: FULL Family Communication: no family present at time of exam Disposition Plan: Resolution withdrawals    Consultants: none  Procedure/Significant Events: none   Culture none  Antibiotics: none  DVT prophylaxis: Lovenox   Devices NA  LINES / TUBES:  NA    Continuous Infusions:   Objective: VITAL SIGNS: Temp: 98.2 F (36.8 C) (03/16 1935) Temp Source: Oral (03/16 1935) BP: 108/73 mmHg (03/16 2000) Pulse Rate: 67 (03/16 2000) SPO2; FIO2:   Intake/Output Summary (Last 24 hours) at 09/03/15 2144 Last data filed at 09/03/15 1800  Gross  per 24 hour  Intake    960 ml  Output   1100 ml  Net   -140 ml     Exam: General:  A/O 4,, headache refractory to treatment, No acute respiratory distress Eyes: Negative headache, negative scleral hemorrhage ENT: Negative Runny nose, negative gingival bleeding, Neck:  Negative scars, masses, torticollis, lymphadenopathy, JVD Lungs: Clear to auscultation bilaterally without wheezes or crackles Cardiovascular: Regular rate and rhythm without murmur gallop or rub normal S1 and S2 Abdomen:negative abdominal pain, nondistended, positive soft, bowel sounds, no rebound, no ascites, no appreciable mass Extremities: No significant cyanosis, clubbing, or edema bilateral lower extremities, resting/intention tremors bilateral upper extremity  Psychiatric:  Negative depression, negative anxiety, negative fatigue, negative mania  Neurologic:  Cranial nerves II through XII intact, tongue/uvula midline, all extremities muscle strength 5/5, sensation intact throughout,  negative dysarthria, negative expressive aphasia, negative receptive aphasia.   Data Reviewed: Basic Metabolic Panel:  Recent Labs Lab 08/30/15 1013 08/30/15 1016 08/31/15 0304 09/01/15 0220 09/03/15 0315  NA 137  --  141 139 138  K 3.6  --  3.8 3.7 4.0  CL 104  --  107 106 105  CO2 22  --  GLUCOSE 105*  --  96 131* 111*  BUN 8  --  <5* 6 12  CREATININE 1.05  --  0.99 1.03 1.11  CALCIUM 9.1  --  9.2 9.2 9.3  MG  --  1.7  --   --   --    Liver Function Tests:  Recent Labs Lab 08/30/15 1013 08/31/15 0304 09/01/15 0220 09/03/15 0315  AST 57* 38 33 34  ALT  101* 76* 63 63  ALKPHOS 111 98 98 97  BILITOT 0.8 0.9 0.5 0.1*  PROT 6.9 6.5 6.1* 6.8  ALBUMIN 4.1 3.6 3.3* 3.7    Recent Labs Lab 08/30/15 1013 09/03/15 0315  LIPASE 74* 127*    Recent Labs Lab 09/01/15 0220  AMMONIA 41*   CBC:  Recent Labs Lab 08/30/15 1013 08/31/15 0304 09/01/15 0220 09/03/15 0315  WBC 6.7 5.0 5.2 6.8  HGB 13.6  13.4 12.9* 14.4  HCT 40.8 41.1 39.9 43.4  MCV 97.8 98.8 99.5 97.7  PLT 332 299 251 261   Cardiac Enzymes: No results for input(s): CKTOTAL, CKMB, CKMBINDEX, TROPONINI in the last 168 hours. BNP (last 3 results) No results for input(s): BNP in the last 8760 hours.  ProBNP (last 3 results) No results for input(s): PROBNP in the last 8760 hours.  CBG: No results for input(s): GLUCAP in the last 168 hours.  Recent Results (from the past 240 hour(s))  MRSA PCR Screening     Status: None   Collection Time: 08/30/15  1:36 PM  Result Value Ref Range Status   MRSA by PCR NEGATIVE NEGATIVE Final    Comment:        The GeneXpert MRSA Assay (FDA approved for NASAL specimens only), is one component of a comprehensive MRSA colonization surveillance program. It is not intended to diagnose MRSA infection nor to guide or monitor treatment for MRSA infections.      Studies:  Recent x-ray studies have been reviewed in detail by the Attending Physician  Scheduled Meds:  Scheduled Meds: . cloNIDine  0.1 mg Oral TID  . enoxaparin (LOVENOX) injection  40 mg Subcutaneous Q24H  . folic acid  1 mg Oral Daily  . multivitamin with minerals  1 tablet Oral Daily  . nicotine  21 mg Transdermal Daily  . sodium chloride flush  3 mL Intravenous Q12H  . thiamine  100 mg Oral Daily    Time spent on care of this patient: 40 mins   WOODS, Roselind MessierURTIS J , MD  [facility] Office  [phone] Pager - [phone]  On-Call/Text Page:      [email or web address]      password [email or web address]  If 7PM-7AM, please contact night-coverage [email or web address] Password [email or web address] 09/03/2015, 9:44 PM   LOS: 4 days   Care during the described time interval was provided by me .  I have reviewed this patient's available data, including medical history, events of note, physical examination, and all test results as part of my evaluation. I have personally reviewed and interpreted all radiology studies.   Carolyne Littlesurtis Woods,  MD [phone] Pager

## 2015-09-04 ENCOUNTER — Inpatient Hospital Stay (HOSPITAL_COMMUNITY): Payer: MEDICAID

## 2015-09-04 MED ORDER — LORAZEPAM 2 MG/ML IJ SOLN
2.0000 mg | INTRAMUSCULAR | Status: DC | PRN
  Administered 2015-09-04: 2 mg via INTRAVENOUS
  Filled 2015-09-04: qty 1

## 2015-09-04 MED ORDER — LORAZEPAM 2 MG/ML IJ SOLN
0.5000 mg | Freq: Four times a day (QID) | INTRAMUSCULAR | Status: DC | PRN
  Administered 2015-09-04 – 2015-09-05 (×3): 1 mg via INTRAVENOUS
  Filled 2015-09-04 (×3): qty 1

## 2015-09-04 MED ORDER — PROCHLORPERAZINE EDISYLATE 5 MG/ML IJ SOLN
10.0000 mg | Freq: Once | INTRAMUSCULAR | Status: AC
  Administered 2015-09-04: 10 mg via INTRAVENOUS
  Filled 2015-09-04: qty 2

## 2015-09-04 MED ORDER — PROCHLORPERAZINE EDISYLATE 5 MG/ML IJ SOLN
5.0000 mg | Freq: Three times a day (TID) | INTRAMUSCULAR | Status: DC | PRN
  Administered 2015-09-04 – 2015-09-05 (×2): 5 mg via INTRAVENOUS
  Filled 2015-09-04 (×4): qty 1

## 2015-09-04 MED ORDER — CLONIDINE HCL 0.1 MG PO TABS
0.1000 mg | ORAL_TABLET | Freq: Two times a day (BID) | ORAL | Status: DC
  Filled 2015-09-04: qty 1

## 2015-09-04 MED ORDER — KETOROLAC TROMETHAMINE 30 MG/ML IJ SOLN
30.0000 mg | Freq: Once | INTRAMUSCULAR | Status: AC
  Administered 2015-09-04: 30 mg via INTRAVENOUS
  Filled 2015-09-04: qty 1

## 2015-09-04 MED ORDER — KETOROLAC TROMETHAMINE 30 MG/ML IJ SOLN
30.0000 mg | Freq: Three times a day (TID) | INTRAMUSCULAR | Status: DC | PRN
  Administered 2015-09-04 – 2015-09-05 (×2): 30 mg via INTRAVENOUS
  Filled 2015-09-04 (×2): qty 1

## 2015-09-04 NOTE — Progress Notes (Signed)
Patient verbalized that he still has 8/10 pain from headache that has not been relieved by any of the PRN medications that he has taken thus far. Requests that MD address his pain urgently. Advised patient that I will administer Imitrex as ordered since he did not get relief from prior medicines. MD made aware of patients concerns via text page. Will continue to monitor patient closely.

## 2015-09-04 NOTE — Progress Notes (Signed)
[place] TEAM 1 - Stepdown/ICU TEAM PROGRESS NOTE  Timothy Richard:[id number] DOB: 1981/04/26 DOA: 08/30/2015 PCP: No PCP Per Patient  Admit HPI / Brief Narrative: 35 y.o. male with hx of alcohol and tobacco abuse, wanted to quit both, presented to the ED with anxiety, tremor, and palpitations. He saw an outpatient physician and was given Valium with minimal relief. Work up in the ER included a Lipase of 74, minimal elevation of LFTs, and stable hemodynamics. He was given Ativan and had some improvement. He denied any drug use. He has no prior DT or severe alcohol withdrawal. He said he drinks hard liquor, but would not quantify.   HPI/Subjective: The patient is sleeping comfortably when I entered the room.  He quickly awakens to my voice and answers my questions.  He states that his headache is much improved after Toradol and Compazine.  He states that his "shaking" hasn't improved but is not shaking until after he tells me that.  He denies chest pain or shortness of breath.  Assessment/Plan:  Acute EtOH withdrawal  The patient is alert and oriented and on physical exam no longer tremulous - discontinue CIWA protocol as there are signs the patient is becoming accustomed to frequent Ativan dosing - cont ativan low dose prn only - begin to mobilize - pt trying to decide on intpt v/s outpt EtOH rehab  Refractory HA No sx to suggests actual migraine - avoid narcotics - trial of fioricet failed - did not improve w/ a triptan - compazine and toradol lead to sedation/sleeping - CT head unrevealing   EtOH abuse - Tobacco abuse  Pt desires to stop drinking permanently - he is trying to decide on outpt v/s inpt tx   Code Status: FULL Family Communication: no family present at time of exam Disposition Plan: stable for transfer to tele bed - stop CIWA and follow w/ only judicious prn benzo - NO NARCOTICS - PT/OT evals (says he is dizzy when standing)    Consultants: none  Procedures: none  Antibiotics: none  DVT prophylaxis: lovenox   Objective: Blood pressure 111/80, pulse 58, temperature 97.8 F (36.6 C), temperature source Oral, resp. rate 14, height  (1.778 m), weight 81.874 kg (180 lb 8 oz), SpO2 98 %.  Intake/Output Summary (Last 24 hours) at 09/04/15 1626 Last data filed at 09/04/15 1500  Gross per 24 hour  Intake   1090 ml  Output   1400 ml  Net   -310 ml   Exam: General: No acute respiratory distress - alert and oriented  Lungs: Clear to auscultation bilaterally w/o wheezes or crackles Cardiovascular: Regular rate and rhythm  Abdomen: Nontender, nondistended, soft, bowel sounds positive, no rebound Extremities: No cyanosis, clubbing, or edema bilateral lower extremities  Data Reviewed:  Basic Metabolic Panel:  Recent Labs Lab 08/30/15 1013 08/30/15 1016 08/31/15 0304 09/01/15 0220 09/03/15 0315  NA 137  --  141 139 138  K 3.6  --  3.8 3.7 4.0  CL 104  --  107 106 105  CO2 22  --  GLUCOSE 105*  --  96 131* 111*  BUN 8  --  <5* 6 12  CREATININE 1.05  --  0.99 1.03 1.11  CALCIUM 9.1  --  9.2 9.2 9.3  MG  --  1.7  --   --   --     CBC:  Recent Labs Lab 08/30/15 1013 08/31/15 0304 09/01/15 0220 09/03/15 0315  WBC 6.7  5.0 5.2 6.8  HGB 13.6 13.4 12.9* 14.4  HCT 40.8 41.1 39.9 43.4  MCV 97.8 98.8 99.5 97.7  PLT 332 299 251 261    Liver Function Tests:  Recent Labs Lab 08/30/15 1013 08/31/15 0304 09/01/15 0220 09/03/15 0315  AST 57* 38 33 34  ALT 101* 76* 63 63  ALKPHOS 111 98 98 97  BILITOT 0.8 0.9 0.5 0.1*  PROT 6.9 6.5 6.1* 6.8  ALBUMIN 4.1 3.6 3.3* 3.7    Recent Labs Lab 08/30/15 1013 09/03/15 0315  LIPASE 74* 127*    Recent Labs Lab 09/01/15 0220  AMMONIA 41*    Coags:  Recent Labs Lab 09/01/15 0220 09/03/15 0315  INR 0.98 0.99    Recent Results (from the past 240 hour(s))  MRSA PCR Screening     Status: None   Collection Time:  08/30/15  1:36 PM  Result Value Ref Range Status   MRSA by PCR NEGATIVE NEGATIVE Final    Comment:        The GeneXpert MRSA Assay (FDA approved for NASAL specimens only), is one component of a comprehensive MRSA colonization surveillance program. It is not intended to diagnose MRSA infection nor to guide or monitor treatment for MRSA infections.      Studies:   Recent x-ray studies have been reviewed in detail by the Attending Physician  Scheduled Meds:  Scheduled Meds: . cloNIDine  0.1 mg Oral TID  . enoxaparin (LOVENOX) injection  40 mg Subcutaneous Q24H  . folic acid  1 mg Oral Daily  . multivitamin with minerals  1 tablet Oral Daily  . nicotine  21 mg Transdermal Daily  . sodium chloride flush  3 mL Intravenous Q12H  . thiamine  100 mg Oral Daily    Time spent on care of this patient: 25 mins   [facility] Regional Medical CenterMCCLUNG,Deniesha Stenglein T , MD   [facility] Office  [phone] Pager - Text Page per Loretha StaplerAmion as per below:  On-Call/Text Page:      [email or web address]      password [email or web address]  If 7PM-7AM, please contact night-coverage [email or web address] Password [email or web address] 09/04/2015, 4:26 PM   LOS: 5 days

## 2015-09-05 DIAGNOSIS — F10230 Alcohol dependence with withdrawal, uncomplicated: Secondary | ICD-10-CM

## 2015-09-05 MED ORDER — THIAMINE HCL 100 MG PO TABS: 100.0000 mg | ORAL_TABLET | Freq: Every day | ORAL | Status: DC

## 2015-09-05 MED ORDER — BUTALBITAL-APAP-CAFFEINE 50-325-40 MG PO TABS: 1.0000 | ORAL_TABLET | ORAL | Status: DC | PRN

## 2015-09-05 MED ORDER — FOLIC ACID 1 MG PO TABS: 1.0000 mg | ORAL_TABLET | Freq: Every day | ORAL | Status: DC

## 2015-09-05 NOTE — Discharge Summary (Signed)
Physician Discharge Summary  Timothy Richard [name] DOB: 1981/02/07 DOA: 08/30/2015  PCP: No PCP Per Patient  Admit date: 08/30/2015 Discharge date: 09/05/2015  Time spent: > 35 minutes  Recommendations for Outpatient Follow-up:  1. Continue to encourage alcohol cessation   Discharge Diagnoses:  Active Problems:   Alcohol withdrawal (HCC)   Pancreatitis   Alcohol withdrawal delirium, acute, hyperactive (HCC)   Headache above the eye region   Alcohol abuse   Tobacco abuse   Hallucinations due to alcohol [facility](HCC)   Discharge Condition: Stable  Diet recommendation: Regular diet  Filed Weights   08/30/15 1329 09/04/15 2150  Weight: 81.874 kg (180 lb 8 oz) 84.324 kg (185 lb 14.4 oz)    History of present illness:  35 year old presented with complaints of alcohol withdrawal.  Hospital Course:  Alcohol withdrawal - On physical exam patient has no anxiety or tremors. He reportedly states to me that he has not had a drink in over 5 days. As such I do not feel that it is warranted to discharge him on a benzodiazepine or Librium. - I have encouraged patient to look for outpatient social support groups for abstinence of alcohol. - I will allow patient to have 2 days off work to help him set this up - We'll discharge on folate and thiamine supplementation.  Headaches - Improvement with Fioricet will continue on discharge  Procedures:  None  Consultations:  None  Discharge Exam: Filed Vitals:   09/05/15 0027 09/05/15 1100  BP: 123/79 93/46  Pulse: 66 80  Temp:    Resp:      General: Pt in nad, alert and awake Cardiovascular: rrr, no mrg Respiratory: cta bl, no wheezes  Discharge Instructions   Discharge Instructions    Call MD for:  redness, tenderness, or signs of infection (pain, swelling, redness, odor or green/yellow discharge around incision site)    Complete by:  As directed      Call MD for:  severe uncontrolled pain    Complete by:  As directed       Call MD for:  temperature >100.4    Complete by:  As directed      Diet - low sodium heart healthy    Complete by:  As directed      Discharge instructions    Complete by:  As directed   Please abstain from drinking alcohol. In the 2 days provided that I gave you off from work please be sure to set up plans to join social group for abstinence of alcohol.     Increase activity slowly    Complete by:  As directed           Current Discharge Medication List    START taking these medications   Details  butalbital-acetaminophen-caffeine (FIORICET, ESGIC) 50-325-40 MG tablet Take 1-2 tablets by mouth every 4 (four) hours as needed for headache. Qty: 14 tablet, Refills: 0    folic acid (FOLVITE) 1 MG tablet Take 1 tablet (1 mg total) by mouth daily. Qty: 30 tablet, Refills: 0    thiamine 100 MG tablet Take 1 tablet (100 mg total) by mouth daily. Qty: 30 tablet, Refills: 0      STOP taking these medications     DiazePAM (VALIUM PO)        No Known Allergies    The results of significant diagnostics from this hospitalization (including imaging, microbiology, ancillary and laboratory) are listed below for reference.    Significant Diagnostic Studies: Ct  Head Wo Contrast  09/04/2015  CLINICAL DATA:  35 year old male with severe diffuse headache. Initial encounter. ETOH withdrawal. EXAM: CT HEAD WITHOUT CONTRAST TECHNIQUE: Contiguous axial images were obtained from the base of the skull through the vertex without intravenous contrast. COMPARISON:  [facility] 11/22/2004 noncontrast head CT. FINDINGS: Previous mastoidectomy on the right. 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 Mild chronic left lamina papyracea fracture. Other visible paranasal sinuses an the left mastoids are clear. No acute osseous abnormality identified. Negative orbit and scalp soft tissues. Cerebral volume is at the lower limits of normal for age. No midline  shift, ventriculomegaly, mass effect, evidence of mass lesion, intracranial hemorrhage or evidence of cortically based acute infarction. Gray-white matter differentiation is within normal limits throughout the brain. No suspicious intracranial vascular hyperdensity. IMPRESSION: 1.  Normal noncontrast CT appearance of the brain. 2. Chronic right mastoidectomy and mild left lamina papyracea fracture. Electronically Signed   By: Odessa Fleming M.D.   On: 09/04/2015 12:59    Microbiology: Recent Results (from the past 240 hour(s))  MRSA PCR Screening     Status: None   Collection Time: 08/30/15  1:36 PM  Result Value Ref Range Status   MRSA by PCR NEGATIVE NEGATIVE Final    Comment:        The GeneXpert MRSA Assay (FDA approved for NASAL specimens only), is one component of a comprehensive MRSA colonization surveillance program. It is not intended to diagnose MRSA infection nor to guide or monitor treatment for MRSA infections.      Labs: Basic Metabolic Panel:  Recent Labs Lab 08/30/15 1013 08/30/15 1016 08/31/15 0304 09/01/15 0220 09/03/15 0315  NA 137  --  141 139 138  K 3.6  --  3.8 3.7 4.0  CL 104  --  107 106 105  CO2 22  --  GLUCOSE 105*  --  96 131* 111*  BUN 8  --  <5* 6 12  CREATININE 1.05  --  0.99 1.03 1.11  CALCIUM 9.1  --  9.2 9.2 9.3  MG  --  1.7  --   --   --    Liver Function Tests:  Recent Labs Lab 08/30/15 1013 08/31/15 0304 09/01/15 0220 09/03/15 0315  AST 57* 38 33 34  ALT 101* 76* 63 63  ALKPHOS 111 98 98 97  BILITOT 0.8 0.9 0.5 0.1*  PROT 6.9 6.5 6.1* 6.8  ALBUMIN 4.1 3.6 3.3* 3.7    Recent Labs Lab 08/30/15 1013 09/03/15 0315  LIPASE 74* 127*    Recent Labs Lab 09/01/15 0220  AMMONIA 41*   CBC:  Recent Labs Lab 08/30/15 1013 08/31/15 0304 09/01/15 0220 09/03/15 0315  WBC 6.7 5.0 5.2 6.8  HGB 13.6 13.4 12.9* 14.4  HCT 40.8 41.1 39.9 43.4  MCV 97.8 98.8 99.5 97.7  PLT 332 299 251 261   Cardiac Enzymes: No  results for input(s): CKTOTAL, CKMB, CKMBINDEX, TROPONINI in the last 168 hours. BNP: BNP (last 3 results) No results for input(s): BNP in the last 8760 hours.  ProBNP (last 3 results) No results for input(s): PROBNP in the last 8760 hours.  CBG: No results for input(s): GLUCAP in the last 168 hours.   Signed:  Penny Pia MD.  [facility] 09/05/2015, 2:51 PM

## 2015-09-05 NOTE — Progress Notes (Signed)
Pt given discharge instructions, prescriptions, and care notes. Pt verbalized understanding AEB no further questions or concerns at this time. IV was discontinued, no redness, pain, or swelling noted at this time. Telemetry discontinued and Centralized Telemetry was notified. Pt left the floor in stable condition. 

## 2015-09-05 NOTE — Evaluation (Signed)
Physical Therapy Evaluation Patient Details Name: Timothy SpruceChristopher B Richard MRN: [id number] DOB: [birth year] Today's Date: 09/05/2015   History of Present Illness  Pt is a 35 y.o. male admitted for alcohol withdrawal.  Clinical Impression  PT eval complete. Pt is independent with all functional mobility in room and supervision provided for hallway ambulation for safety. He reports dizziness that worsens with activity. No LOB noted during ambulation. Symptoms do not appear to be vestibular related. Pt educated on need to stay out of bed with lights on during day. No further PT intervention indicated. PT signing off.    Follow Up Recommendations No PT follow up;Supervision - Intermittent    Equipment Recommendations  None recommended by PT    Recommendations for Other Services       Precautions / Restrictions Precautions Precautions: None Restrictions Weight Bearing Restrictions: No      Mobility  Bed Mobility Overal bed mobility: Independent                Transfers Overall transfer level: Independent Equipment used: None                Ambulation/Gait Ambulation/Gait assistance: Supervision Ambulation Distance (Feet): 350 Feet Assistive device: None Gait Pattern/deviations: WFL(Within Functional Limits) Gait velocity: WFL   General Gait Details: occassional use of handrail in hallway  Stairs            Wheelchair Mobility    Modified Rankin (Stroke Patients Only)       Balance Overall balance assessment: No apparent balance deficits (not formally assessed)                                           Pertinent Vitals/Pain Pain Assessment: Faces Pain Location: dull headache with ambulation Pain Intervention(s): Monitored during session;Repositioned    Home Living Family/patient expects to be discharged to:: Private residence Living Arrangements: Alone Available Help at Discharge: Friend(s);Family;Available  PRN/intermittently Type of Home: Apartment Home Access: Stairs to enter Entrance Stairs-Rails: Doctor, general practiceight;Left Entrance Stairs-Number of Steps: 7 Home Layout: One level Home Equipment: None      Prior Function Level of Independence: Independent               Hand Dominance        Extremity/Trunk Assessment   Upper Extremity Assessment: Overall WFL for tasks assessed           Lower Extremity Assessment: Overall WFL for tasks assessed      Cervical / Trunk Assessment: Normal  Communication   Communication: No difficulties  Cognition Arousal/Alertness: Awake/alert Behavior During Therapy: WFL for tasks assessed/performed Overall Cognitive Status: Within Functional Limits for tasks assessed                      General Comments      Exercises        Assessment/Plan    PT Assessment Patent does not need any further PT services  PT Diagnosis Difficulty walking;Acute pain   PT Problem List    PT Treatment Interventions     PT Goals (Current goals can be found in the Care Plan section) Acute Rehab PT Goals Patient Stated Goal: stop drinking PT Goal Formulation: All assessment and education complete, DC therapy    Frequency     Barriers to discharge        Co-evaluation  End of Session Equipment Utilized During Treatment: Gait belt Activity Tolerance: Patient tolerated treatment well Patient left: in chair;with call bell/phone within reach Nurse Communication: Mobility status         Time: 1331-1350 PT Time Calculation (min) (ACUTE ONLY): 19 min   Charges:   PT Evaluation $PT Eval Moderate Complexity: 1 Procedure     PT G Codes:        [name] 09/05/2015, 1:59 PM

## 2015-11-30 ENCOUNTER — Encounter (HOSPITAL_COMMUNITY): Payer: Self-pay | Admitting: *Deleted

## 2015-11-30 ENCOUNTER — Emergency Department (HOSPITAL_COMMUNITY): Payer: Self-pay

## 2015-11-30 ENCOUNTER — Emergency Department (HOSPITAL_COMMUNITY)
Admission: EM | Admit: 2015-11-30 | Discharge: 2015-11-30 | Disposition: A | Discharge status: Home / Self Care | Payer: Self-pay | Attending: Emergency Medicine | Admitting: Emergency Medicine

## 2015-11-30 DIAGNOSIS — F17200 Nicotine dependence, unspecified, uncomplicated: Secondary | ICD-10-CM | POA: Insufficient documentation

## 2015-11-30 DIAGNOSIS — R109 Unspecified abdominal pain: Secondary | ICD-10-CM

## 2015-11-30 HISTORY — DX: Alcohol abuse, uncomplicated: F10.10

## 2015-11-30 HISTORY — DX: Headache: R51

## 2015-11-30 HISTORY — DX: Disorder of kidney and ureter, unspecified: N28.9

## 2015-11-30 HISTORY — DX: Headache, unspecified: R51.9

## 2015-11-30 LAB — COMPREHENSIVE METABOLIC PANEL
ALT: 37 U/L (ref 17–63)
AST: 34 U/L (ref 15–41)
Albumin: 4.1 g/dL (ref 3.5–5.0)
Alkaline Phosphatase: 84 U/L (ref 38–126)
Anion gap: 11 (ref 5–15)
BILIRUBIN TOTAL: 0.2 mg/dL — AB (ref 0.3–1.2)
BUN: 12 mg/dL (ref 6–20)
CO2: 21 mmol/L — ABNORMAL LOW (ref 22–32)
CREATININE: 1.22 mg/dL (ref 0.61–1.24)
Calcium: 9.3 mg/dL (ref 8.9–10.3)
Chloride: 104 mmol/L (ref 101–111)
GFR calc Af Amer: >60 mL/min (ref >60)
Glucose, Bld: 134 mg/dL — ABNORMAL HIGH (ref 65–99)
POTASSIUM: 3.7 mmol/L (ref 3.5–5.1)
SODIUM: 136 mmol/L (ref 135–145)
TOTAL PROTEIN: 6.8 g/dL (ref 6.5–8.1)

## 2015-11-30 LAB — URINALYSIS, ROUTINE W REFLEX MICROSCOPIC
Bilirubin Urine: NEGATIVE
Glucose, UA: NEGATIVE mg/dL
Hgb urine dipstick: NEGATIVE
KETONES UR: NEGATIVE mg/dL
LEUKOCYTES UA: NEGATIVE
NITRITE: NEGATIVE
PROTEIN: NEGATIVE mg/dL
Specific Gravity, Urine: 1.018 (ref 1.005–1.030)
pH: 6 (ref 5.0–8.0)

## 2015-11-30 LAB — CBC
HEMATOCRIT: 43.7 % (ref 39.0–52.0)
Hemoglobin: 14.6 g/dL (ref 13.0–17.0)
MCH: 30.2 pg (ref 26.0–34.0)
MCHC: 33.4 g/dL (ref 30.0–36.0)
MCV: 90.5 fL (ref 78.0–100.0)
PLATELETS: 234 10*3/uL (ref 150–400)
RBC: 4.83 MIL/uL (ref 4.22–5.81)
RDW: 13.1 % (ref 11.5–15.5)
WBC: 7.5 10*3/uL (ref 4.0–10.5)

## 2015-11-30 LAB — LIPASE, BLOOD: LIPASE: 50 U/L (ref 11–51)

## 2015-11-30 MED ORDER — NAPROXEN 250 MG PO TABS: 250.0000 mg | ORAL_TABLET | Freq: Two times a day (BID) | ORAL | Status: DC | PRN

## 2015-11-30 MED ORDER — METHOCARBAMOL 500 MG PO TABS: 1000.0000 mg | ORAL_TABLET | Freq: Four times a day (QID) | ORAL | Status: AC | PRN

## 2015-11-30 MED ORDER — HYDROMORPHONE HCL 1 MG/ML IJ SOLN
1.0000 mg | Freq: Once | INTRAMUSCULAR | Status: AC
  Administered 2015-11-30: 1 mg via INTRAMUSCULAR
  Filled 2015-11-30: qty 1

## 2015-11-30 MED ORDER — ONDANSETRON 4 MG PO TBDP
8.0000 mg | ORAL_TABLET | Freq: Once | ORAL | Status: AC
  Administered 2015-11-30: 8 mg via ORAL
  Filled 2015-11-30: qty 2

## 2015-11-30 NOTE — Discharge Instructions (Signed)
Take the prescriptions as directed.  Apply moist heat or ice to the area(s) of discomfort, for 15 minutes at a time, several times per day for the next few days.  Do not fall asleep on a heating or ice pack.  Call your regular medical doctor today to schedule a follow up appointment this week.  Return to the Emergency Department immediately if worsening. ° °

## 2015-11-30 NOTE — ED Notes (Signed)
Patient did not have an IV

## 2015-11-30 NOTE — ED Notes (Signed)
Pt states acute onset lower back pain and diarrhea 6 x per day since pain started.  Hx of lithotripsy 3 weeks prior, but pt states pain is not the same.

## 2015-11-30 NOTE — ED Notes (Signed)
Doctor at bedside.

## 2015-11-30 NOTE — ED Provider Notes (Signed)
CSN: [id number]     Arrival date & time 11/30/15  0741 History   First MD Initiated Contact with Patient 11/30/15 [phone]     Chief Complaint  Patient presents with  . Flank Pain      HPI Pt was seen at 0950. Per pt, c/o sudden onset and persistence of constant right > left sided flank "pain" that began yesterday.  Pt describes the pain as "sore." Has been associated with nausea. Endorses hx of kidney stone on the left side 3 weeks ago. Denies testicular pain/swelling, no dysuria/hematuria, no abd pain, no black or blood in emesis, no CP/SOB, no fevers, no rash.     Past Medical History  Diagnosis Date  . Renal disorder     kidney stones  . Alcohol abuse   . Headache    Past Surgical History  Procedure Laterality Date  . Mastoidectomy    . Hand tendon surgery Left     Social History  Substance Use Topics  . Smoking status: Light Tobacco Smoker  . Smokeless tobacco: None  . Alcohol Use: Yes     Comment: half  gallon vodka a day - none since 08/2015    Review of Systems ROS: Statement: All systems negative except as marked or noted in the HPI; Constitutional: Negative for fever and chills. ; ; Eyes: Negative for eye pain, redness and discharge. ; ; ENMT: Negative for ear pain, hoarseness, nasal congestion, sinus pressure and sore throat. ; ; Cardiovascular: Negative for chest pain, palpitations, diaphoresis, dyspnea and peripheral edema. ; ; Respiratory: Negative for cough, wheezing and stridor. ; ; Gastrointestinal: +nausea. Negative for vomiting, diarrhea, abdominal pain, blood in stool, hematemesis, jaundice and rectal bleeding. . ; ; Genitourinary: +flank pain. Negative for dysuria and hematuria. ; ; Musculoskeletal: Negative for back pain and neck pain. Negative for swelling and trauma.; ; Skin: Negative for pruritus, rash, abrasions, blisters, bruising and skin lesion.; ; Neuro: Negative for headache, lightheadedness and neck stiffness. Negative for weakness, altered level of  consciousness, altered mental status, extremity weakness, paresthesias, involuntary movement, seizure and syncope.      Allergies  Review of patient's allergies indicates no known allergies.  Home Medications   Prior to Admission medications   Medication Sig Start Date End Date Taking? Authorizing Provider  ibuprofen (ADVIL,MOTRIN) 200 MG tablet Take 800 mg by mouth every 6 (six) hours as needed for mild pain.   Yes Historical Provider, MD   BP 134/107 mmHg  Pulse 84  Temp(Src) 98.4 F (36.9 C) (Oral)  Resp 18  Ht 5\' 10"  (1.778 m)  Wt 189 lb (85.73 kg)  BMI 27.12 kg/m2  SpO2 100% Physical Exam  0955: Physical examination:  Nursing notes reviewed; Vital signs and O2 SAT reviewed;  Constitutional: Well developed, Well nourished, Well hydrated, In no acute distress; Head:  Normocephalic, atraumatic; Eyes: EOMI, PERRL, No scleral icterus; ENMT: Mouth and pharynx normal, Mucous membranes moist; Neck: Supple, Full range of motion, No lymphadenopathy; Cardiovascular: Regular rate and rhythm, No murmur, rub, or gallop; Respiratory: Breath sounds clear & equal bilaterally, No rales, rhonchi, wheezes.  Speaking full sentences with ease, Normal respiratory effort/excursion; Chest: Nontender, Movement normal; Abdomen: Soft, Nontender, Nondistended, Normal bowel sounds; Genitourinary: No CVA tenderness; Spine:  No midline CS, TS, LS tenderness. +TTP right lumbar paraspinal muscles. No rash.;; Extremities: Pulses normal, No tenderness, No edema, No calf edema or asymmetry.; Neuro: AA&Ox3, Major CN grossly intact.  Speech clear. No gross focal motor or sensory deficits in extremities.;  Skin: Color normal, Warm, Dry.   ED Course  Procedures (including critical care time) Labs Review  Imaging Review  I have personally reviewed and evaluated these images and lab results as part of my medical decision-making.   EKG Interpretation None      MDM  MDM Reviewed: previous chart, nursing note and  vitals Reviewed previous: labs Interpretation: labs and CT scan      Results for orders placed or performed during the hospital encounter of 11/30/15  Lipase, blood  Result Value Ref Range   Lipase 50 11 - 51 U/L  Comprehensive metabolic panel  Result Value Ref Range   Sodium 136 135 - 145 mmol/L   Potassium 3.7 3.5 - 5.1 mmol/L   Chloride 104 101 - 111 mmol/L   CO2 21 (L) 22 - 32 mmol/L   Glucose, Bld 134 (H) 65 - 99 mg/dL   BUN 12 6 - 20 mg/dL   Creatinine, Ser 1.61 0.61 - 1.24 mg/dL   Calcium 9.3 8.9 - 09.6 mg/dL   Total Protein 6.8 6.5 - 8.1 g/dL   Albumin 4.1 3.5 - 5.0 g/dL   AST 34 15 - 41 U/L   ALT 37 17 - 63 U/L   Alkaline Phosphatase 84 38 - 126 U/L   Total Bilirubin 0.2 (L) 0.3 - 1.2 mg/dL   GFR calc non Af Amer >60 >60 mL/min   GFR calc Af Amer >60 >60 mL/min   Anion gap 11 5 - 15  CBC  Result Value Ref Range   WBC 7.5 4.0 - 10.5 K/uL   RBC 4.83 4.22 - 5.81 MIL/uL   Hemoglobin 14.6 13.0 - 17.0 g/dL   HCT 04.5 40.9 - 81.1 %   MCV 90.5 78.0 - 100.0 fL   MCH 30.2 26.0 - 34.0 pg   MCHC 33.4 30.0 - 36.0 g/dL   RDW 91.4 78.2 - 95.6 %   Platelets 234 150 - 400 K/uL  Urinalysis, Routine w reflex microscopic  Result Value Ref Range   Color, Urine YELLOW YELLOW   APPearance CLEAR CLEAR   Specific Gravity, Urine 1.018 1.005 - 1.030   pH 6.0 5.0 - 8.0   Glucose, UA NEGATIVE NEGATIVE mg/dL   Hgb urine dipstick NEGATIVE NEGATIVE   Bilirubin Urine NEGATIVE NEGATIVE   Ketones, ur NEGATIVE NEGATIVE mg/dL   Protein, ur NEGATIVE NEGATIVE mg/dL   Nitrite NEGATIVE NEGATIVE   Leukocytes, UA NEGATIVE NEGATIVE   Ct Renal Stone Study 11/30/2015  CLINICAL DATA:  Bilateral flank pain for 1 day. History of stone removal on the left 3 weeks ago. EXAM: CT ABDOMEN AND PELVIS WITHOUT CONTRAST TECHNIQUE: Multidetector CT imaging of the abdomen and pelvis was performed following the standard protocol without IV contrast. COMPARISON:  CT abdomen and pelvis 11/24/2015. FINDINGS: Mild  dependent atelectasis is present in the lung bases. No pleural or pericardial effusion. There are no urinary tract stones on the right or left. Minimal stranding about the proximal left ureter may be related to recent stone removal. The urinary tracts are otherwise unremarkable. The liver, gallbladder, spleen, adrenal glands and pancreas appear normal. The stomach, small and large bowel and appendix appear normal. No lymphadenopathy or fluid. No focal bony abnormality. Degenerative disc disease lower thoracic spine noted. IMPRESSION: Negative for urinary tract stone. No acute abnormality or finding to explain the patient's symptoms. Minimal stranding about the proximal left ureter is likely related to recent stone removal. Electronically Signed   By: Drusilla Kanner M.D.  On: 11/30/2015 11:07    1250:  Workup reassuring. Pt has been ambulatory with steady gait, easy resps, NAD. Tx symptomatically, f/u PMD. Of note: [place] Controlled Substance Database accessed: in the past 1 month, pt has received 7 narcotic rx, written by 3 different providers and filled at 3 different pharmacies. Will not rx further narcotics; pt aware. Dx and testing d/w pt.  Questions answered.  Verb understanding, agreeable to d/c home with outpt f/u.   Samuel Jester, DO 12/02/15 2246

## 2015-11-30 NOTE — ED Notes (Signed)
Patient transported to CT 

## 2015-12-07 ENCOUNTER — Encounter (HOSPITAL_BASED_OUTPATIENT_CLINIC_OR_DEPARTMENT_OTHER): Payer: Self-pay | Admitting: Emergency Medicine

## 2015-12-07 ENCOUNTER — Telehealth (HOSPITAL_BASED_OUTPATIENT_CLINIC_OR_DEPARTMENT_OTHER): Payer: Self-pay | Admitting: Emergency Medicine

## 2016-02-13 ENCOUNTER — Encounter (HOSPITAL_COMMUNITY): Payer: Self-pay | Admitting: *Deleted

## 2016-02-13 ENCOUNTER — Emergency Department (HOSPITAL_COMMUNITY)
Admission: EM | Admit: 2016-02-13 | Discharge: 2016-02-13 | Disposition: A | Discharge status: Home / Self Care | Payer: Self-pay | Attending: Emergency Medicine | Admitting: Emergency Medicine

## 2016-02-13 DIAGNOSIS — K029 Dental caries, unspecified: Secondary | ICD-10-CM | POA: Insufficient documentation

## 2016-02-13 DIAGNOSIS — F17200 Nicotine dependence, unspecified, uncomplicated: Secondary | ICD-10-CM | POA: Insufficient documentation

## 2016-02-13 DIAGNOSIS — K0889 Other specified disorders of teeth and supporting structures: Secondary | ICD-10-CM

## 2016-02-13 MED ORDER — PENICILLIN V POTASSIUM 500 MG PO TABS: 500.0000 mg | ORAL_TABLET | Freq: Four times a day (QID) | ORAL | 0 refills | Status: AC

## 2016-02-13 MED ORDER — NAPROXEN 375 MG PO TABS: 375.0000 mg | ORAL_TABLET | Freq: Two times a day (BID) | ORAL | 0 refills | Status: AC

## 2016-02-13 NOTE — ED Provider Notes (Signed)
[facility]-EMERGENCY DEPT Provider Note   CSN: [id number] Arrival date & time: 02/13/16  0746     History   Chief Complaint Chief Complaint  Patient presents with  . Dental Pain    HPI Timothy Richard is a 35 y.o. male.  35 year old Caucasian male past medical history of alcohol abuse presents with toothache. The pain started approximately one week ago. Went to his dentist this past Tuesday. Patient dental insurance is still pending through work. The dental office told him that he needed his tooth pulled and the pain got worse to come to the ER. He states he will make appointment with dentist to have tooth pulled once dental insurance goes through. Patient has been trying ibuprofen with little relief. Nothing makes better or worse. Denies any fevers, chills, facial swelling, sore throat, difficulty swallowing, chest pain, shortness of breath, nausea, vomiting, diarrhea, abdominal pain.      Past Medical History:  Diagnosis Date  . Alcohol abuse   . Headache   . Renal disorder    kidney stones    Patient Active Problem List   Diagnosis Date Noted  . 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 Alcohol withdrawal delirium, acute, hyperactive (HCC)   . Headache above the eye region   . Alcohol abuse   . Tobacco abuse   . Alcohol withdrawal (HCC) 08/30/2015  . Pancreatitis 08/30/2015    Past Surgical History:  Procedure Laterality Date  . HAND TENDON SURGERY Left   . MASTOIDECTOMY         Home Medications    Prior to Admission medications   Medication Sig Start Date End Date Taking? Authorizing Provider  ibuprofen (ADVIL,MOTRIN) 200 MG tablet Take 800 mg by mouth every 6 (six) hours as needed for mild pain.   Yes Historical Provider, MD  methocarbamol (ROBAXIN) 500 MG tablet Take 2 tablets (1,000 mg total) by mouth 4 (four) times daily as needed for muscle spasms (muscle spasm/pain). 11/30/15  Yes Samuel JesterKathleen McManus, DO  naproxen (NAPROSYN) 375 MG tablet Take 1 tablet (375  mg total) by mouth 2 (two) times daily. 02/13/16   Rise MuKenneth T Quartez Lagos, PA-C  penicillin v potassium (VEETID) 500 MG tablet Take 1 tablet (500 mg total) by mouth 4 (four) times daily. 02/13/16 02/20/16  Rise MuKenneth T Andora Krull, PA-C    Family History History reviewed. No pertinent family history.  Social History Social History  Substance Use Topics  . Smoking status: Light Tobacco Smoker  . Smokeless tobacco: Never Used  . Alcohol use Yes     Comment: half  gallon vodka a day - none since 08/2015     Allergies   Review of patient's allergies indicates no known allergies.   Review of Systems Review of Systems  Constitutional: Negative for chills and fever.  HENT: Positive for dental problem. Negative for congestion, ear pain, facial swelling, rhinorrhea, sinus pressure and sore throat.   Eyes: Negative for pain and visual disturbance.  Respiratory: Negative for cough and shortness of breath.   Cardiovascular: Negative for chest pain and palpitations.  Gastrointestinal: Negative for abdominal pain and vomiting.  Genitourinary: Negative for dysuria and hematuria.  Musculoskeletal: Negative for arthralgias and back pain.  Skin: Negative for color change and rash.  Neurological: Negative for dizziness, syncope, weakness, light-headedness and headaches.  All other systems reviewed and are negative.    Physical Exam Updated Vital Signs BP 136/93 (BP Location: Right Arm)   Pulse 78   Temp 97.6 F (36.4 C) (Oral)  Resp 18   SpO2 100%   Physical Exam  Constitutional: He appears well-developed and well-nourished. No distress.  HENT:  Head: Normocephalic and atraumatic.  Right Ear: External ear normal.  Left Ear: External ear normal.  Mouth/Throat: Oropharynx is clear and moist and mucous membranes are normal. Dental caries present. No dental abscesses. No oropharyngeal exudate.    No facial swelling or cervical lymphadenopathy appreciated.  Eyes: Right eye exhibits no discharge.  Left eye exhibits no discharge. No scleral icterus.  Neck: Normal range of motion. Neck supple. No thyromegaly present.  Cardiovascular: Normal rate and regular rhythm.   Pulmonary/Chest: Effort normal and breath sounds normal. No respiratory distress.  Musculoskeletal: Normal range of motion.  Lymphadenopathy:    He has no cervical adenopathy.  Neurological: He is alert.  Skin: Capillary refill takes less than 2 seconds. No pallor.  Nursing note and vitals reviewed.    ED Treatments / Results  Labs (all labs ordered are listed, but only abnormal results are displayed) Labs Reviewed - No data to display  EKG  EKG Interpretation None       Radiology No results found.  Procedures Procedures (including critical care time)  Medications Ordered in ED Medications - No data to display   Initial Impression / Assessment and Plan / ED Course  I have reviewed the triage vital signs and the nursing notes.  Pertinent labs & imaging results that were available during my care of the patient were reviewed by me and considered in my medical decision making (see chart for details).  Clinical Course   Patient with toothache.  No gross abscess.  Exam unconcerning for Ludwig's angina or spread of infection.  Afebrile. No facial edema. Will treat with penicillin and pain medicine.  Urged patient to follow-up with dentist.  Strict return precautions given. Patient discharged home in NAD with stable VS.  Final Clinical Impressions(s) / ED Diagnoses   Final diagnoses:  Pain, dental    New Prescriptions New Prescriptions   NAPROXEN (NAPROSYN) 375 MG TABLET    Take 1 tablet (375 mg total) by mouth 2 (two) times daily.   PENICILLIN V POTASSIUM (VEETID) 500 MG TABLET    Take 1 tablet (500 mg total) by mouth 4 (four) times daily.     Rise Mu, PA-C 02/13/16 1610    Doug Sou, MD 02/13/16 [phone]

## 2016-02-13 NOTE — Discharge Instructions (Signed)
Take abx as prescribed 4 times daily for 7 days. May take naproxen for pain relief. Alternate with tylenol. Follow up with dentist to have tooth assessed. Return if you develop fever, facial swelling, or vomiting.

## 2016-02-13 NOTE — ED Notes (Signed)
Declined W/C at D/C and was escorted to lobby by RN. 

## 2016-02-13 NOTE — ED Triage Notes (Signed)
Pt reports RT sided dental pain .

## 2016-02-16 ENCOUNTER — Encounter (HOSPITAL_COMMUNITY): Payer: Self-pay

## 2016-02-16 ENCOUNTER — Emergency Department (HOSPITAL_COMMUNITY)
Admission: EM | Admit: 2016-02-16 | Discharge: 2016-02-16 | Disposition: A | Discharge status: Home / Self Care | Payer: Self-pay | Attending: Emergency Medicine | Admitting: Emergency Medicine

## 2016-02-16 DIAGNOSIS — K029 Dental caries, unspecified: Secondary | ICD-10-CM

## 2016-02-16 DIAGNOSIS — F17200 Nicotine dependence, unspecified, uncomplicated: Secondary | ICD-10-CM | POA: Insufficient documentation

## 2016-02-16 DIAGNOSIS — K047 Periapical abscess without sinus: Secondary | ICD-10-CM | POA: Insufficient documentation

## 2016-02-16 MED ORDER — BUPIVACAINE-EPINEPHRINE (PF) 0.5% -1:200000 IJ SOLN
1.8000 mL | Freq: Once | INTRAMUSCULAR | Status: AC
  Administered 2016-02-16: 1.8 mL

## 2016-02-16 MED ORDER — OXYCODONE-ACETAMINOPHEN 5-325 MG PO TABS
2.0000 | ORAL_TABLET | Freq: Once | ORAL | Status: AC
  Administered 2016-02-16: 2 via ORAL
  Filled 2016-02-16: qty 2

## 2016-02-16 MED ORDER — KETOROLAC TROMETHAMINE 60 MG/2ML IM SOLN
60.0000 mg | Freq: Once | INTRAMUSCULAR | Status: AC
Start: 2016-02-16 — End: 2016-02-16
  Administered 2016-02-16: 60 mg via INTRAMUSCULAR
  Filled 2016-02-16: qty 2

## 2016-02-16 MED ORDER — LIDOCAINE VISCOUS 2 % MT SOLN
15.0000 mL | OROMUCOSAL | 1 refills | Status: AC | PRN
Start: 2016-02-16 — End: ?

## 2016-02-16 NOTE — Discharge Instructions (Signed)
Keep your previously scheduled appointment with the dentist on Friday. You have been prescribed medication called viscous lidocaine. Swish with this product as needed for dental pain, and spit it out. Do not swallow it. Continue to take any remaining antibiotics.

## 2016-02-16 NOTE — ED Notes (Signed)
Pt has an abscessed tooth on the right side of the bottom row.

## 2016-02-16 NOTE — ED Provider Notes (Signed)
[facility]-EMERGENCY DEPT Provider Note   CSN: [id number] Arrival date & time: 02/16/16  7829     History   Chief Complaint Chief Complaint  Patient presents with  . Dental Pain    HPI Timothy Richard is a 35 y.o. male.  HPI   Timothy Richard is a 35 y.o. male, with a history of Alcohol abuse, presenting to the ED with right lower tooth pain beginning about a week ago. Has been using orajel with little relief. Pain is severe, aching, nonradiating. Pt states his tooth broke off last year and has had problems with his tooth since then. Dentist appt on Sept 1. Has been compliant with PCN.  Denies fever/chills, nausea/vomiting, difficulty breathing or swallowing, facial swelling, or any other complaints.  Past Medical History:  Diagnosis Date  . Alcohol abuse   . Headache   . Renal disorder    kidney stones    Patient Active Problem List   Diagnosis Date Noted  . 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 Alcohol withdrawal delirium, acute, hyperactive (HCC)   . Headache above the eye region   . Alcohol abuse   . Tobacco abuse   . Alcohol withdrawal (HCC) 08/30/2015  . Pancreatitis 08/30/2015    Past Surgical History:  Procedure Laterality Date  . HAND TENDON SURGERY Left   . MASTOIDECTOMY         Home Medications    Prior to Admission medications   Medication Sig Start Date End Date Taking? Authorizing Provider  naproxen (NAPROSYN) 375 MG tablet Take 1 tablet (375 mg total) by mouth 2 (two) times daily. 02/13/16  Yes Rise Mu, PA-C  penicillin v potassium (VEETID) 500 MG tablet Take 1 tablet (500 mg total) by mouth 4 (four) times daily. 02/13/16 02/20/16 Yes Iantha Fallen T Leaphart, PA-C  lidocaine (XYLOCAINE) 2 % solution Use as directed 15 mLs in the mouth or throat as needed for mouth pain. 02/16/16   Bonni Neuser C Ordell Prichett, PA-C  methocarbamol (ROBAXIN) 500 MG tablet Take 2 tablets (1,000 mg total) by mouth 4 (four) times daily as needed for muscle spasms (muscle  spasm/pain). Patient not taking: Reported on 02/16/2016 11/30/15   Samuel Jester, DO    Family History History reviewed. No pertinent family history.  Social History Social History  Substance Use Topics  . Smoking status: Light Tobacco Smoker  . Smokeless tobacco: Never Used  . Alcohol use Yes     Comment: half  gallon vodka a day - none since 08/2015     Allergies   Review of patient's allergies indicates no known allergies.   Review of Systems Review of Systems  HENT: Positive for dental problem. Negative for drooling, facial swelling, trouble swallowing and voice change.   Respiratory: Negative for shortness of breath and stridor.   Gastrointestinal: Negative for nausea and vomiting.  All other systems reviewed and are negative.    Physical Exam Updated Vital Signs BP 137/84 (BP Location: Right Arm)   Pulse 70   Temp 98 F (36.7 C) (Oral)   Resp 16   Ht 5\' 10"  (1.778 m)   Wt 81.6 kg   SpO2 100%   BMI 25.83 kg/m   Physical Exam  Constitutional: He appears well-developed and well-nourished. No distress.  HENT:  Head: Normocephalic and atraumatic.  Significant dental caries noted to right mandibular canine and first premolar. Significant dental caries throughout. No area of swelling or fluctuance to suggest an abscess. Readily handles oral secretions without difficulty.  Mouth opening to at least three finger widths.   Eyes: Conjunctivae are normal.  Neck: Neck supple.  Cardiovascular: Normal rate, regular rhythm and intact distal pulses.   Pulmonary/Chest: Effort normal. No respiratory distress.  Abdominal: Soft. There is no tenderness. There is no guarding.  Musculoskeletal: He exhibits no edema or tenderness.  Lymphadenopathy:    He has no cervical adenopathy.  Neurological: He is alert.  Skin: Skin is warm and dry. He is not diaphoretic.  Psychiatric: He has a normal mood and affect. His behavior is normal.  Nursing note and vitals reviewed.    ED  Treatments / Results  Labs (all labs ordered are listed, but only abnormal results are displayed) Labs Reviewed - No data to display  EKG  EKG Interpretation None       Radiology No results found.  Procedures .Nerve Block Date/Time: 02/16/2016 7:27 AM Performed by: Anselm PancoastJOY, Martha Soltys C Authorized by: Anselm PancoastJOY, Roshonda Sperl C   Consent:    Consent obtained:  Verbal   Consent given by:  Patient   Risks discussed:  Allergic reaction, bleeding, infection, nerve damage, swelling, unsuccessful block and pain   Alternatives discussed:  No treatment Indications:    Indications:  Pain relief Location:    Body area:  Head   Head nerve blocked: Inferior alveolar.   Laterality:  Right Procedure details (see MAR for exact dosages):    Block needle gauge:  27 G   Anesthetic injected:  Bupivacaine 0.5% WITH epi Post-procedure details:    Outcome:  Pain relieved   Patient tolerance of procedure:  Tolerated well, no immediate complications    (including critical care time)  Medications Ordered in ED Medications  oxyCODONE-acetaminophen (PERCOCET/ROXICET) 5-325 MG per tablet 2 tablet (2 tablets Oral Given 02/16/16 0739)  ketorolac (TORADOL) injection 60 mg (60 mg Intramuscular Given 02/16/16 0739)  bupivacaine-epinephrine (MARCAINE W/ EPI) 0.5% -1:200000 injection 1.8 mL (1.8 mLs Infiltration Given 02/16/16 0745)     Initial Impression / Assessment and Plan / ED Course  I have reviewed the triage vital signs and the nursing notes.  Pertinent labs & imaging results that were available during my care of the patient were reviewed by me and considered in my medical decision making (see chart for details).  Clinical Course    Timothy Richard presents with dental pain for the last week.  Dental block successful. No signs of sepsis or Ludwig's angioedema. Patient to follow up with dentist in 3 days. The patient was given instructions for home care as well as return precautions. Patient voices  understanding of these instructions, accepts the plan, and is comfortable with discharge.  Vitals:   02/16/16 0625 02/16/16 0703  BP: 134/98 137/84  Pulse: 72 70  Resp: 17 16  Temp: 97.3 F (36.3 C) 98 F (36.7 C)  TempSrc: Oral Oral  SpO2: 100% 100%  Weight: 84 kg 81.6 kg  Height: 5\' 10"  (1.778 m) 5\' 10"  (1.778 m)     Final Clinical Impressions(s) / ED Diagnoses   Final diagnoses:  Pain due to dental caries    New Prescriptions New Prescriptions   LIDOCAINE (XYLOCAINE) 2 % SOLUTION    Use as directed 15 mLs in the mouth or throat as needed for mouth pain.     Anselm PancoastShawn C Alazae Crymes, PA-C 02/16/16 [id number]    Jacalyn LefevreJulie Haviland, MD 02/17/16 [phone]

## 2016-02-16 NOTE — ED Triage Notes (Signed)
Pt here over the weekend for dental pain and reports no change in pain. Pt reports he has been doing as instructed but no relief.

## 2016-04-20 DEATH — deceased

## 2016-12-15 IMAGING — CT CT RENAL STONE PROTOCOL
2 of 5 series · 15 of 46 positions shown, 17 images · non-contrast
Comparison: CT abdomen and pelvis 11/24/2015.

CLINICAL DATA: Bilateral flank pain for 1 day. History of stone
removal on the left 3 weeks ago.

EXAM:
CT ABDOMEN AND PELVIS WITHOUT CONTRAST
TECHNIQUE: Multidetector CT imaging of the abdomen and pelvis was performed
following the standard protocol without IV contrast.

[Series 4: renal stone 3.0 cor · coronal · 0.61mm/px · 3 of 100 slices shown]
[im 34/100  soft-tissue]
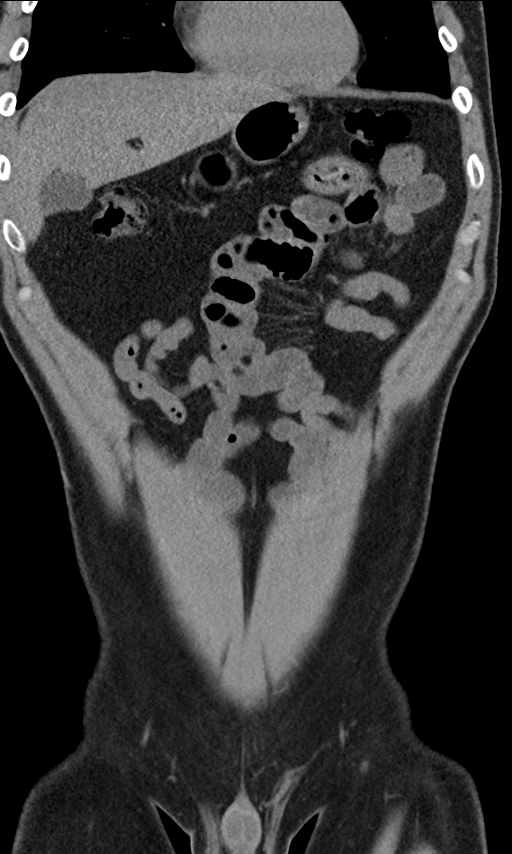
[im 45/100  soft-tissue]
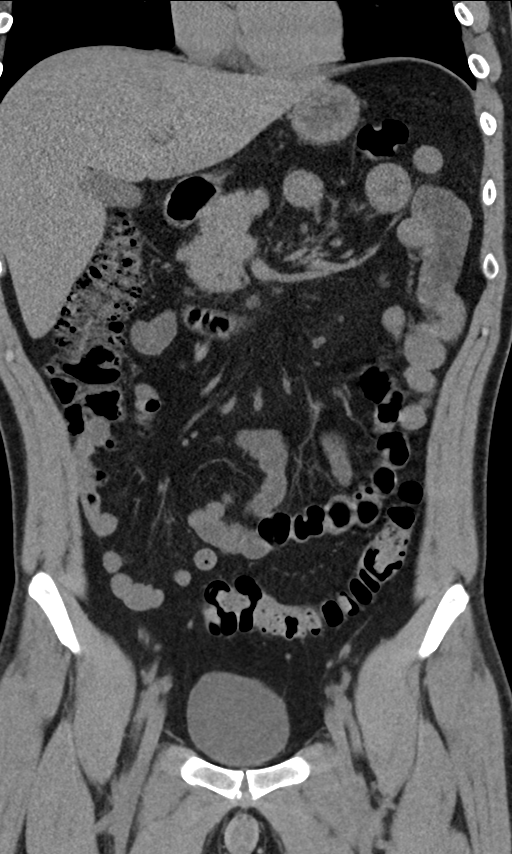
[im 56/100  soft-tissue]
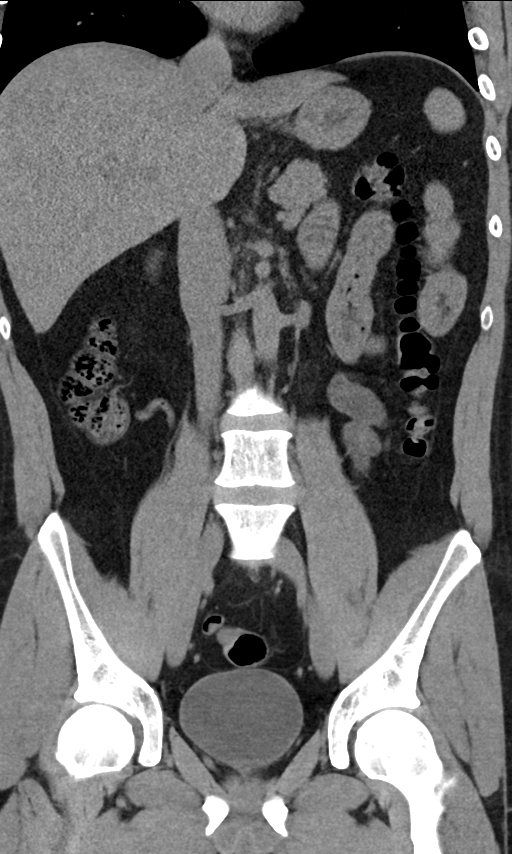

[Series 9: renal stone 5mm · axial · 0.95mm/px · z∈[-514,-44]mm · 12 of 104 slices shown, 14 images]
[im 5/104  soft-tissue]
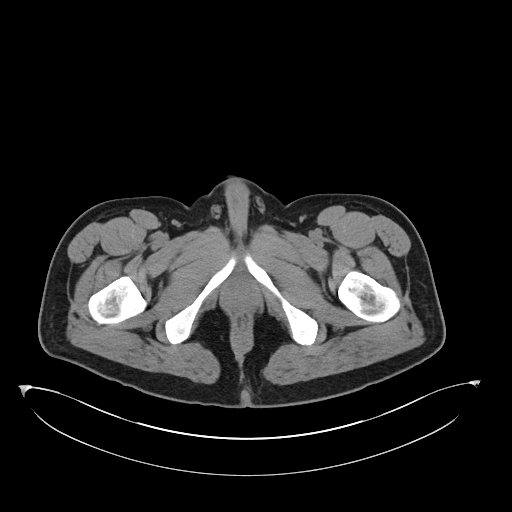
[im 5/104  bone]
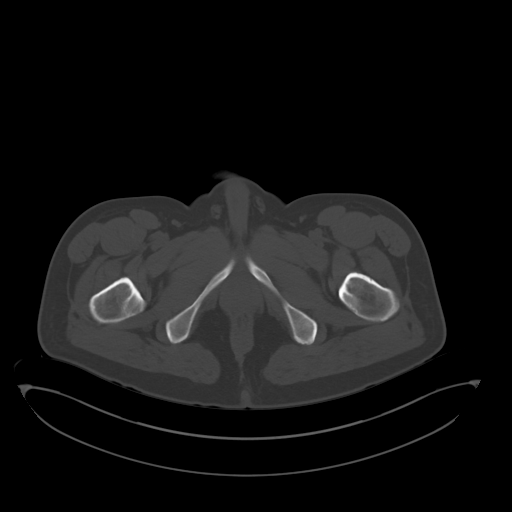
[im 14/104  soft-tissue]
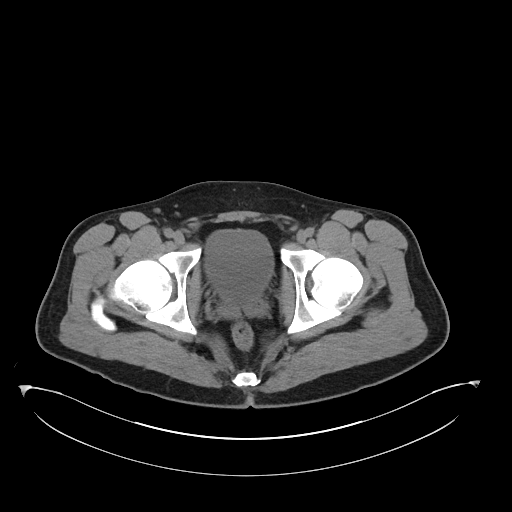
[im 23/104  soft-tissue]
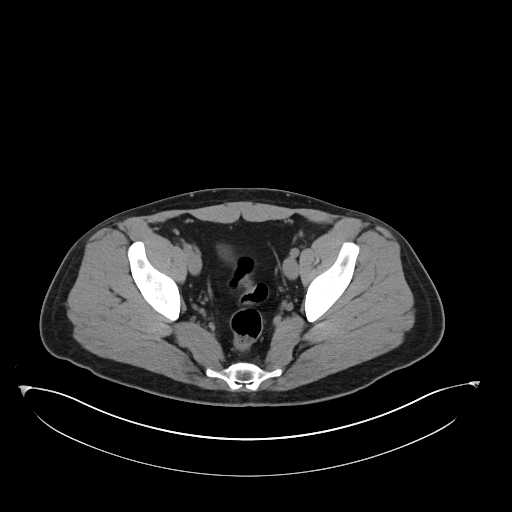
[im 32/104  soft-tissue]
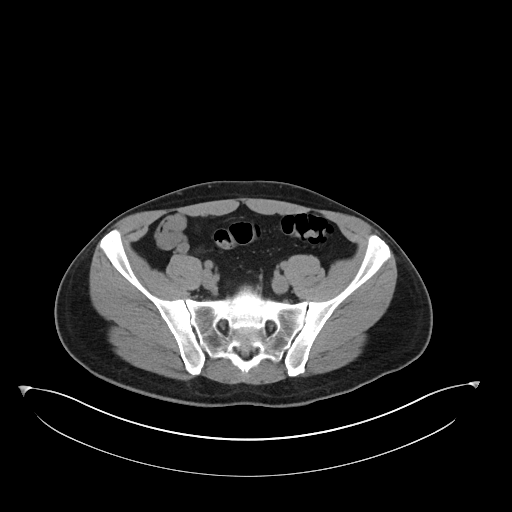
[im 41/104  soft-tissue]
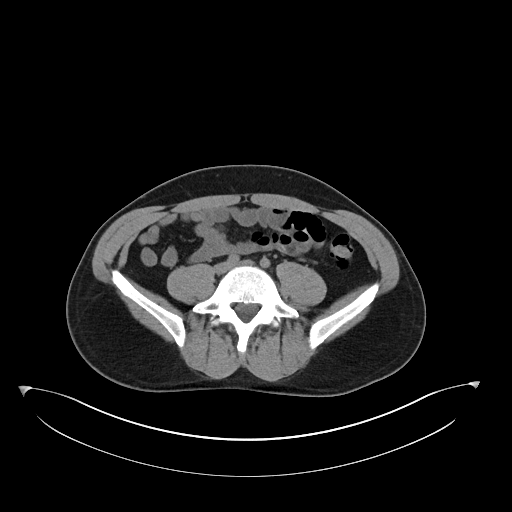
[im 50/104  soft-tissue]
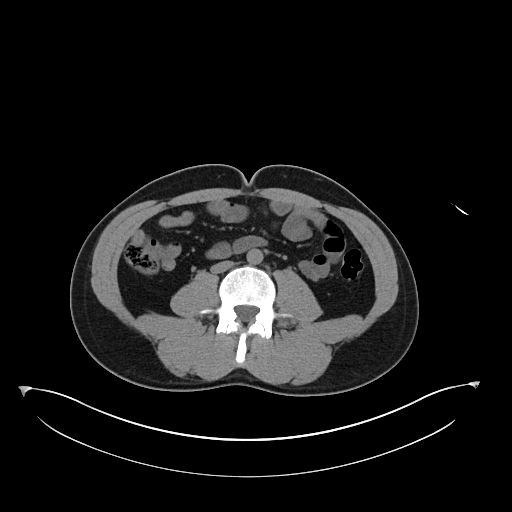
[im 54/104  soft-tissue]
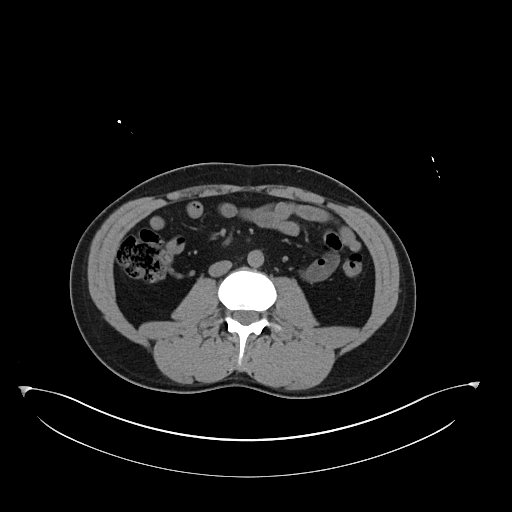
[im 63/104  soft-tissue]
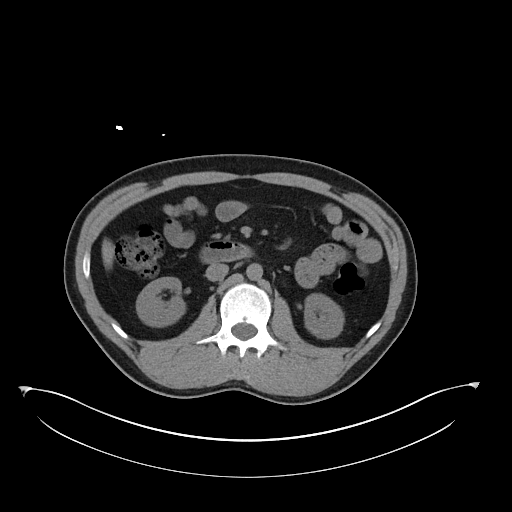
[im 72/104  soft-tissue]
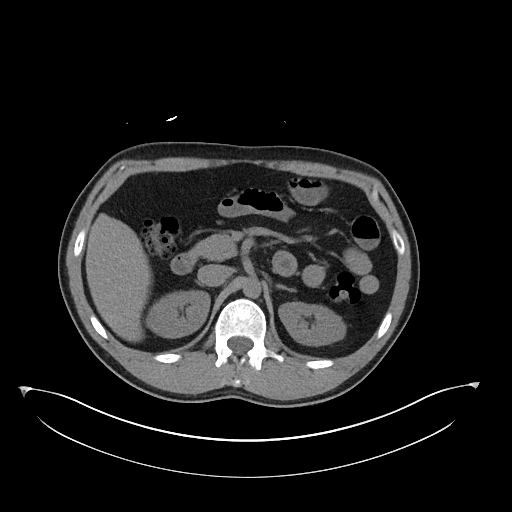
[im 72/104  bone]
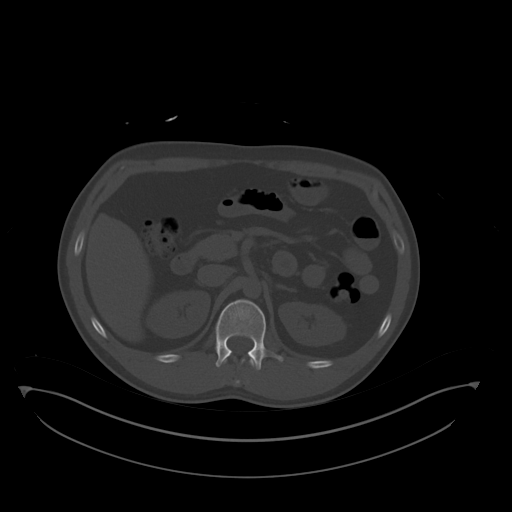
[im 81/104  soft-tissue]
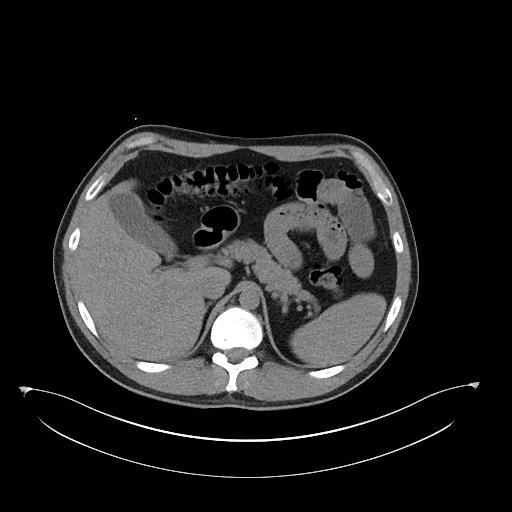
[im 90/104  soft-tissue]
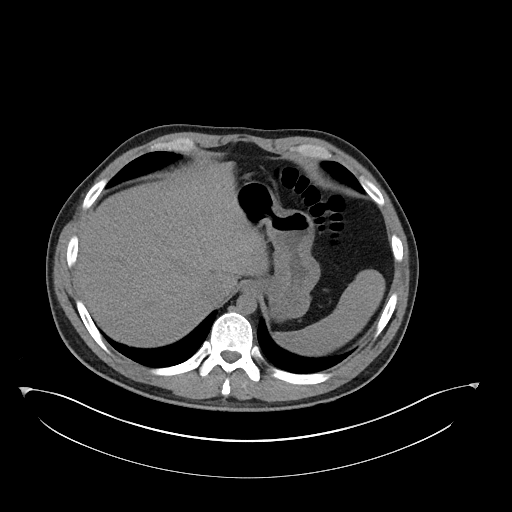
[im 99/104  soft-tissue]
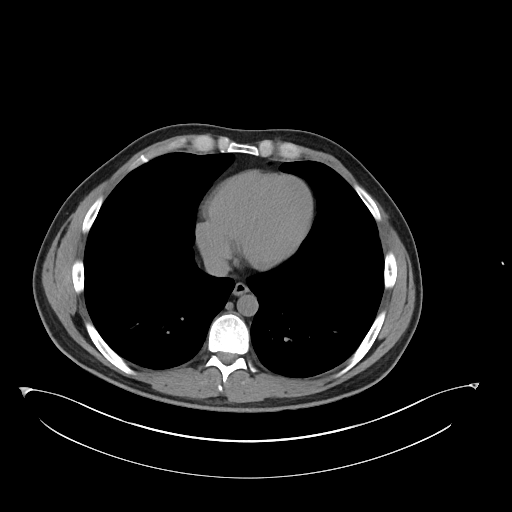

[15 of 46 positions shown; findings below may reference images not displayed]

FINDINGS: Mild dependent atelectasis is present in the lung bases. No pleural
or pericardial effusion.

There are no urinary tract stones on the right or left. Minimal
stranding about the proximal left ureter may be related to recent
stone removal. The urinary tracts are otherwise unremarkable.

The liver, gallbladder, spleen, adrenal glands and pancreas appear
normal. The stomach, small and large bowel and appendix appear
normal. No lymphadenopathy or fluid.

No focal bony abnormality. Degenerative disc disease lower thoracic
spine noted.
IMPRESSION: Negative for urinary tract stone. No acute abnormality or finding to
explain the patient's symptoms.

Minimal stranding about the proximal left ureter is likely related
to recent stone removal.
